# Patient Record
Sex: Male | Born: 1935 | Race: White | Hispanic: No | Marital: Married | Smoking: Current every day smoker
Health system: Southern US, Community
[De-identification: ages and names within clinical notes are randomized; demographics above are authoritative.]

## PROBLEM LIST (undated history)

## (undated) DIAGNOSIS — E785 Hyperlipidemia, unspecified: Secondary | ICD-10-CM

## (undated) HISTORY — PX: KIDNEY STONE SURGERY: SHX686

## (undated) HISTORY — PX: EYE SURGERY: SHX253

## (undated) HISTORY — DX: Hyperlipidemia, unspecified: E78.5

---

## 2002-10-20 ENCOUNTER — Ambulatory Visit (HOSPITAL_COMMUNITY): Admission: RE | Admit: 2002-10-20 | Discharge: 2002-10-20 | Payer: Self-pay | Admitting: Ophthalmology

## 2004-04-28 ENCOUNTER — Ambulatory Visit: Payer: Self-pay | Admitting: Family Medicine

## 2004-06-28 ENCOUNTER — Ambulatory Visit: Payer: Self-pay | Admitting: Family Medicine

## 2004-08-08 ENCOUNTER — Ambulatory Visit: Payer: Self-pay | Admitting: Family Medicine

## 2004-09-21 ENCOUNTER — Ambulatory Visit: Payer: Self-pay | Admitting: Family Medicine

## 2005-02-16 ENCOUNTER — Ambulatory Visit: Payer: Self-pay | Admitting: Family Medicine

## 2005-03-14 ENCOUNTER — Ambulatory Visit: Payer: Self-pay | Admitting: Family Medicine

## 2005-03-22 ENCOUNTER — Ambulatory Visit (HOSPITAL_COMMUNITY): Admission: RE | Admit: 2005-03-22 | Discharge: 2005-03-22 | Payer: Self-pay | Admitting: Urology

## 2005-03-29 ENCOUNTER — Ambulatory Visit (HOSPITAL_COMMUNITY): Admission: RE | Admit: 2005-03-29 | Discharge: 2005-03-29 | Payer: Self-pay | Admitting: Urology

## 2005-04-26 ENCOUNTER — Ambulatory Visit (HOSPITAL_COMMUNITY): Admission: RE | Admit: 2005-04-26 | Discharge: 2005-04-26 | Payer: Self-pay | Admitting: Urology

## 2005-04-28 ENCOUNTER — Ambulatory Visit (HOSPITAL_COMMUNITY): Admission: RE | Admit: 2005-04-28 | Discharge: 2005-04-28 | Payer: Self-pay | Admitting: Urology

## 2005-07-06 ENCOUNTER — Ambulatory Visit: Payer: Self-pay | Admitting: Family Medicine

## 2005-11-08 ENCOUNTER — Ambulatory Visit: Payer: Self-pay | Admitting: Family Medicine

## 2006-01-10 ENCOUNTER — Ambulatory Visit: Payer: Self-pay | Admitting: Family Medicine

## 2006-01-30 ENCOUNTER — Ambulatory Visit: Payer: Self-pay | Admitting: Family Medicine

## 2006-03-12 ENCOUNTER — Ambulatory Visit: Payer: Self-pay | Admitting: Family Medicine

## 2006-07-02 IMAGING — CR DG ABDOMEN 1V
2 series · 2 of 2 positions shown · non-contrast
Comparison: None.

CLINICAL DATA: Left renal calculus. Pre-lithotripsy evaluation.

ABDOMEN - 1 VIEW

[view not recorded (1 of 2)]
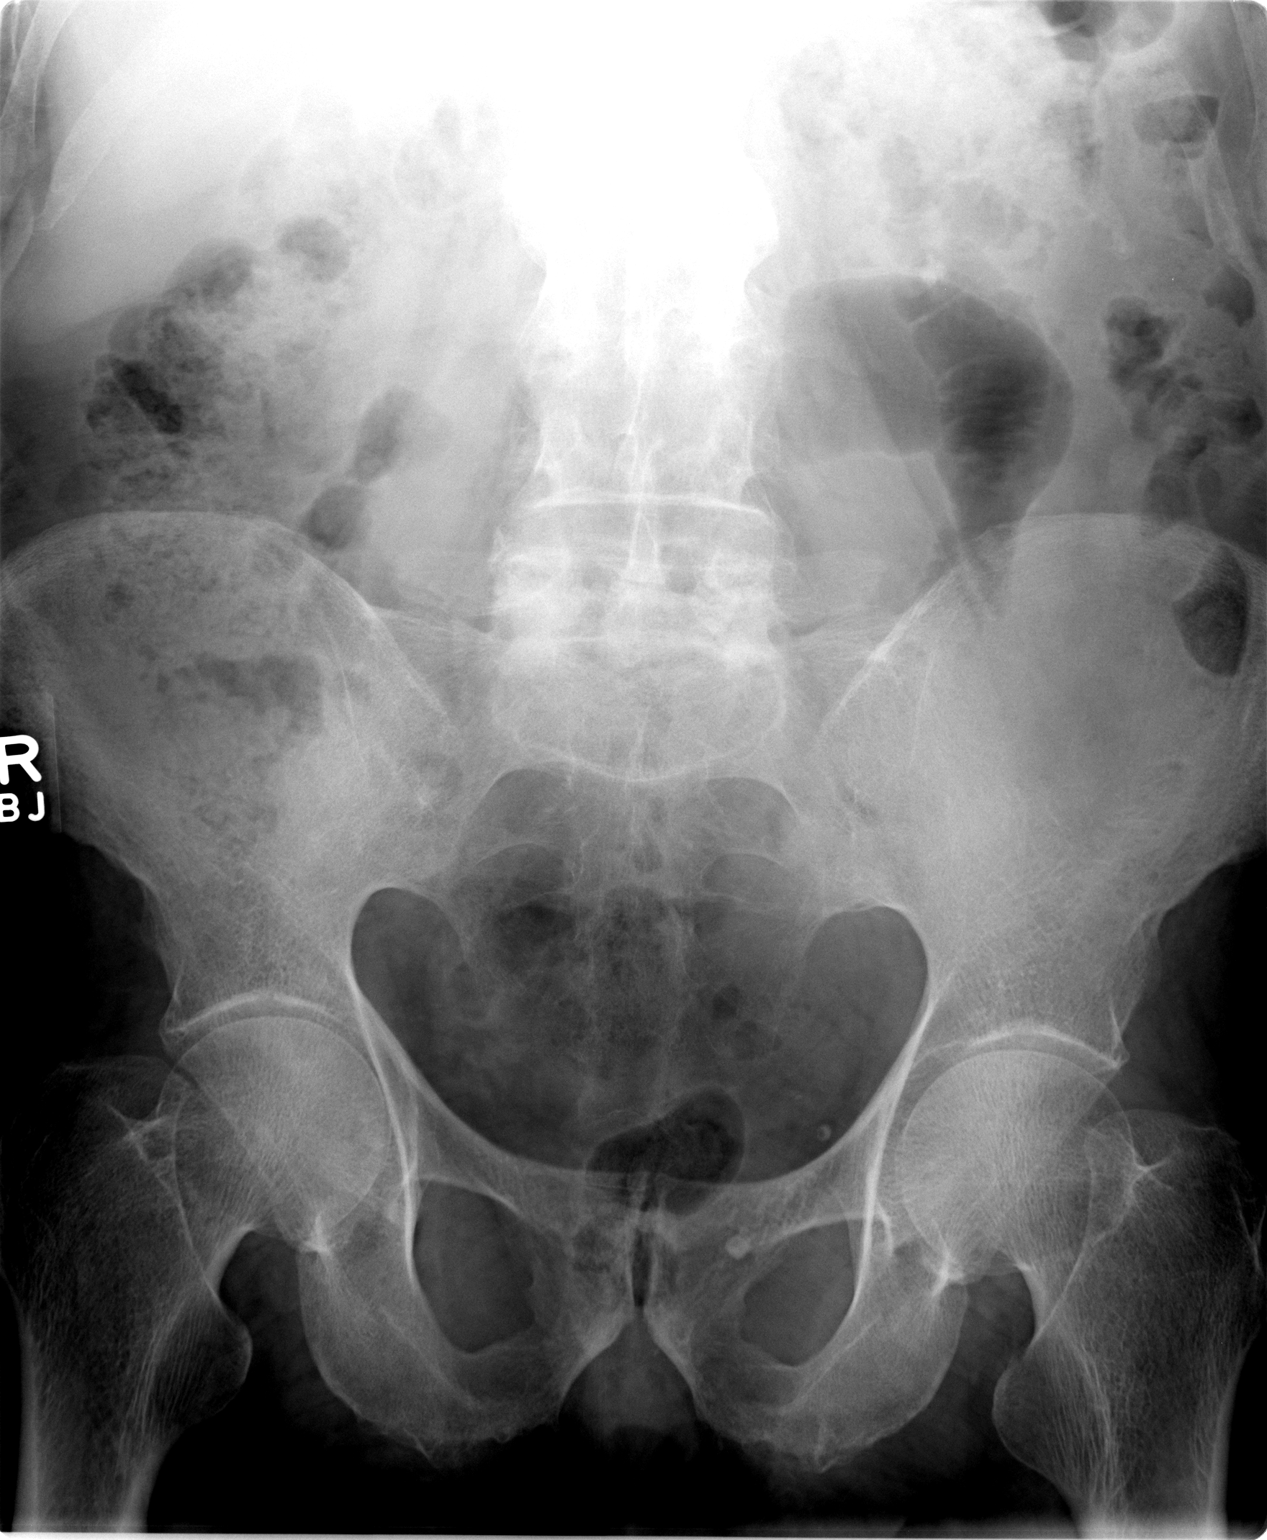

[view not recorded (2 of 2)]
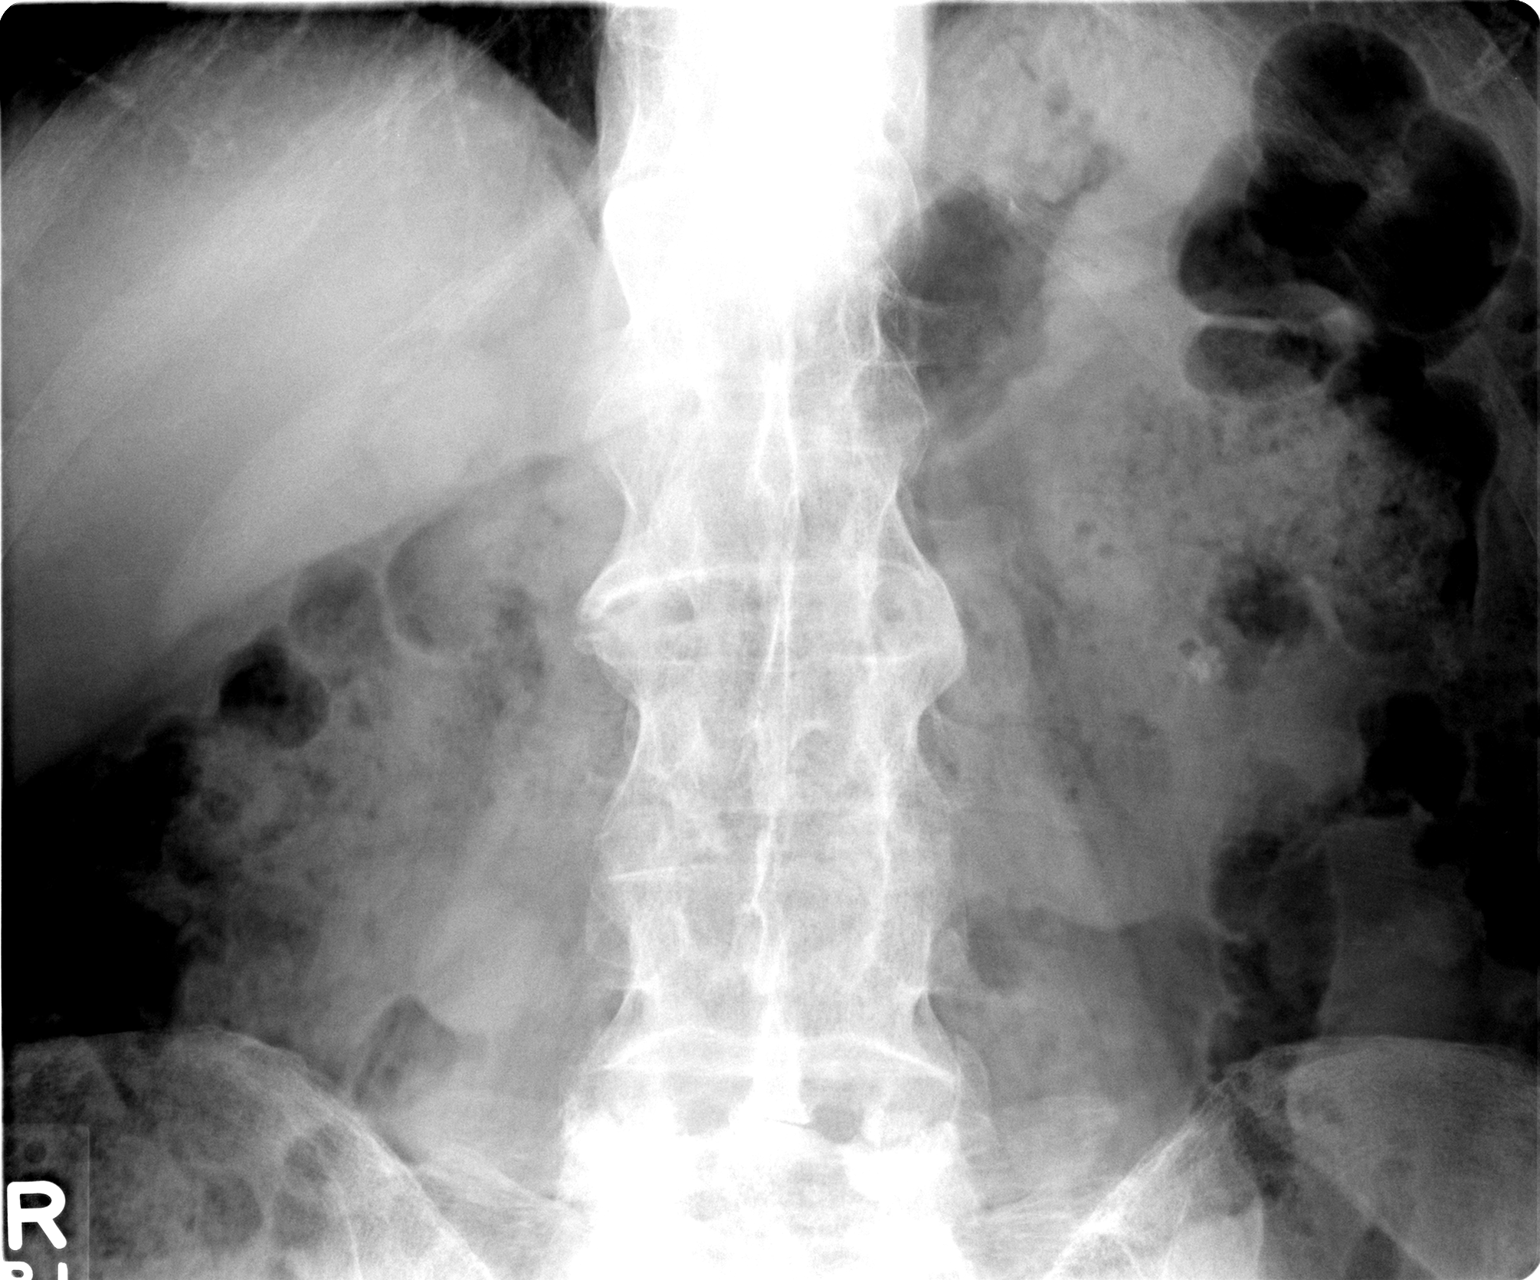

[2 of 2 positions shown; findings below may reference images not displayed]

FINDINGS: 1.0 cm lower pole left renal calculus. Normal bowel gas pattern. Left
pelvic phleboliths. Lumbar spine degenerative changes.
IMPRESSION: 1.0 cm lower pole left renal calculus.

## 2006-08-08 IMAGING — CT CT PELVIS W/O CM
1 of 2 series · 15 of 32 positions shown, 19 images · non-contrast
Comparison: Recent abdomen radiographs.

<!--  IDXRADR:ADDEND:BEGIN -->Addendum Begins
<!--  IDXRADR:ADDEND:INNER_BEGIN -->There is also evidence of bilateral L5 pars interarticularis defects with
probable grade 1 spondylolisthesis at the L5-S1 level. This could be better
evaluated with lumbar spine radiographs.
<!--  IDXRADR:ADDEND:INNER_END -->Addendum Ends
<!--  IDXRADR:ADDEND:END -->Clinical Data:  Followup left renal calculus, status post lithotripsy.

ABDOMEN CT WITHOUT CONTRAST - URINARY STONE PROTOCOL
TECHNIQUE: Multidetector CT imaging of the abdomen was performed following the
urinary stone protocol.  No oral or intravenous contrast was administered.
TECHNIQUE: Multidetector CT imaging of the pelvis was performed following the

[Series 4438: — · axial · 0.65mm/px · z∈[+1254,+1604]mm · 15 of 78 slices shown, 19 images]
[im 4/78  soft-tissue]
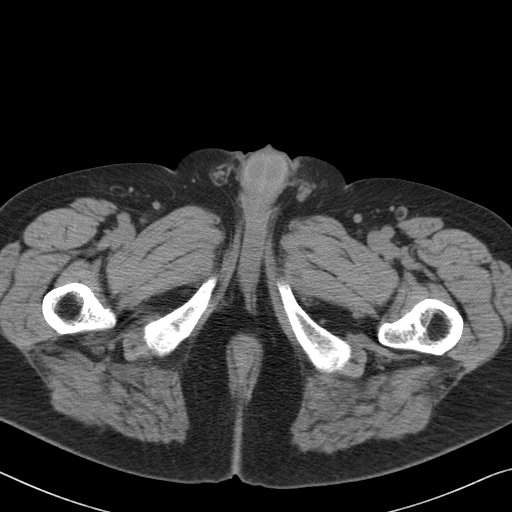
[im 4/78  bone]
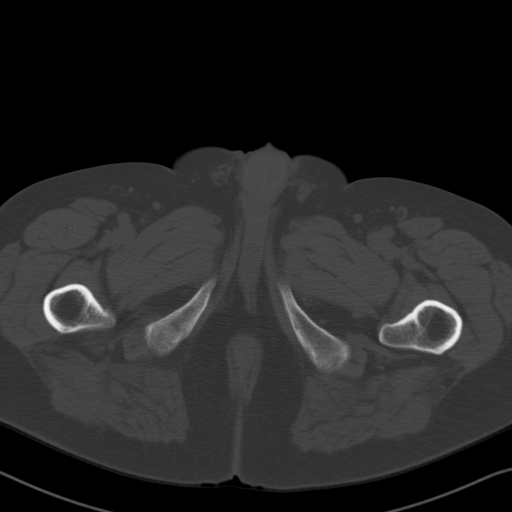
[im 11/78  soft-tissue]
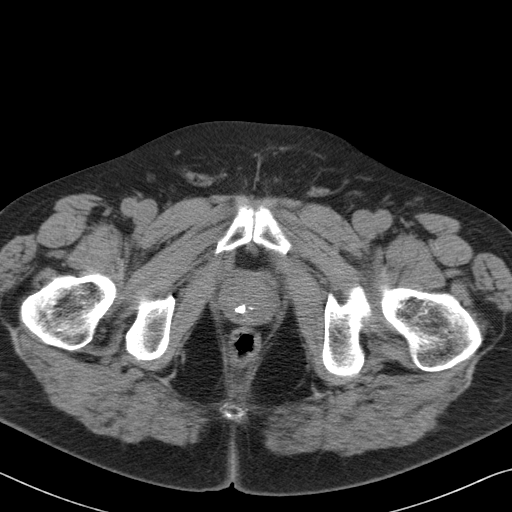
[im 17/78  soft-tissue]
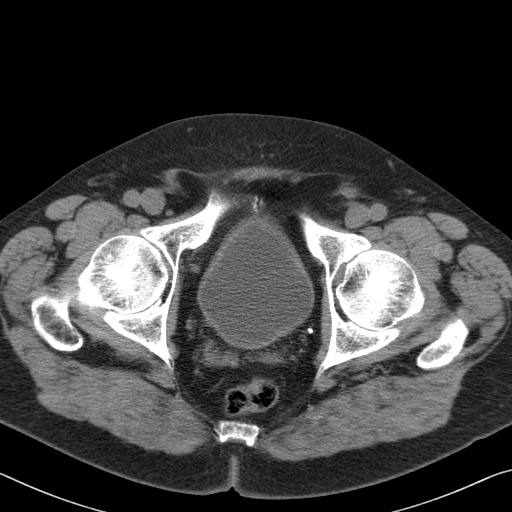
[im 21/78  soft-tissue]
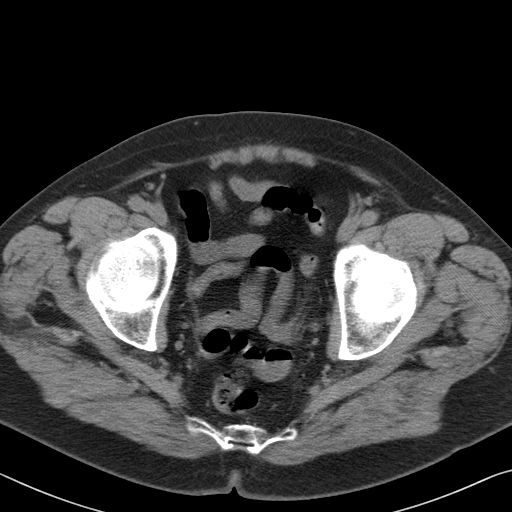
[im 27/78  soft-tissue]
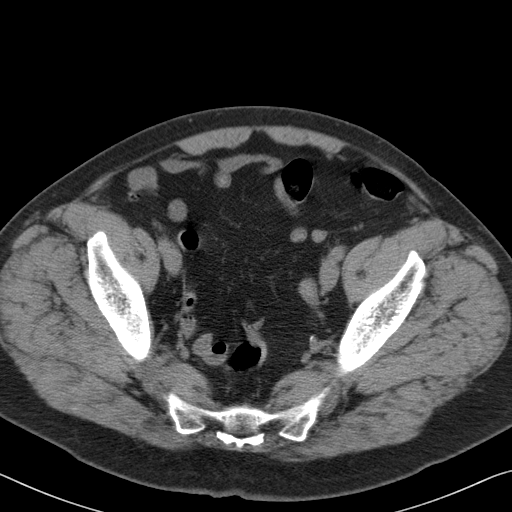
[im 34/78  soft-tissue]
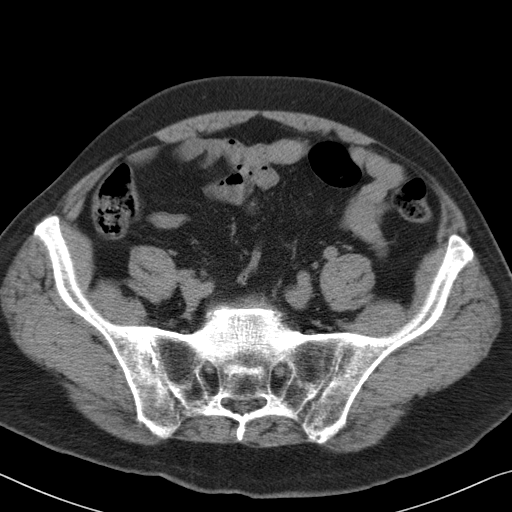
[im 41/78  soft-tissue]
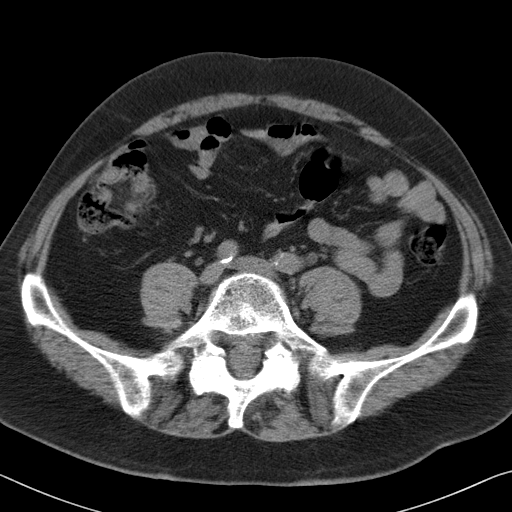
[im 44/78  soft-tissue]
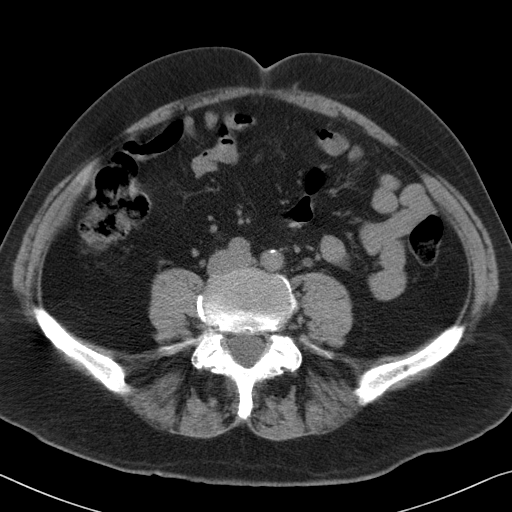
[im 51/78  soft-tissue]
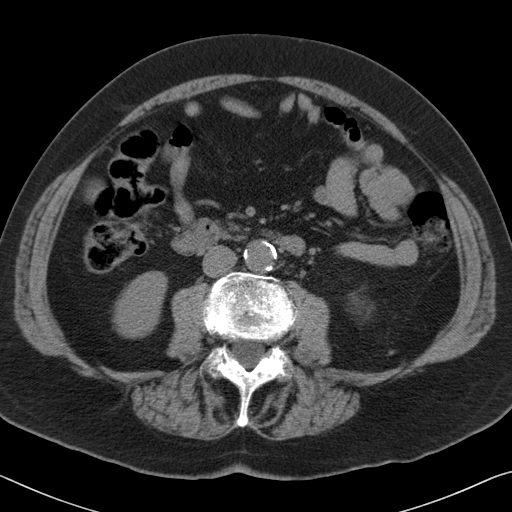
[im 51/78  bone]
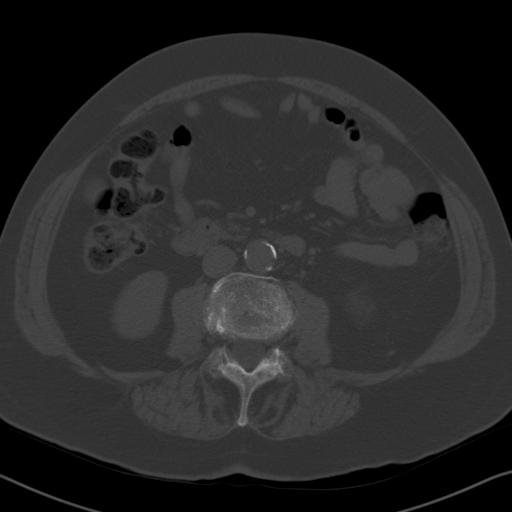
[im 57/78  soft-tissue]
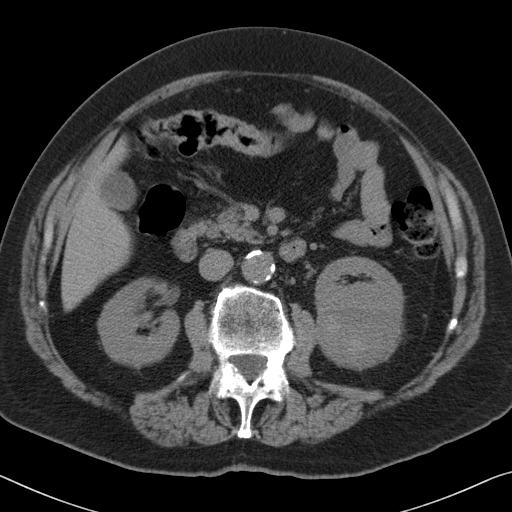
[im 61/78  soft-tissue]
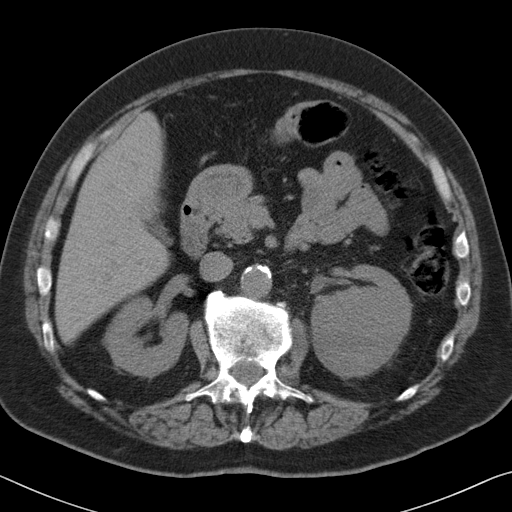
[im 64/78  lung]
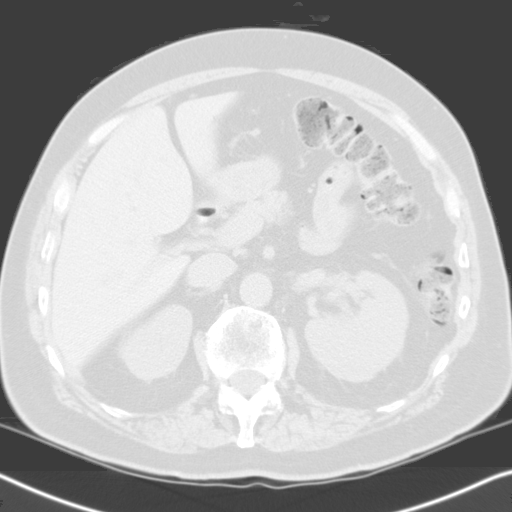
[im 67/78  soft-tissue]
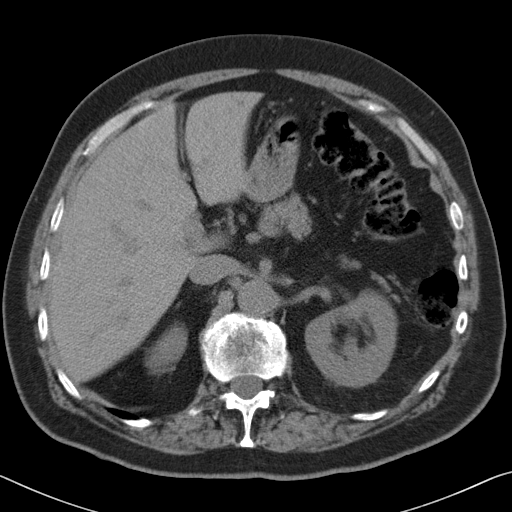
[im 67/78  lung]
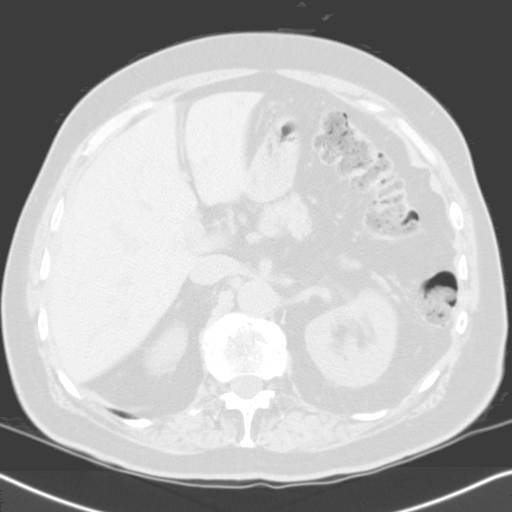
[im 71/78  lung]
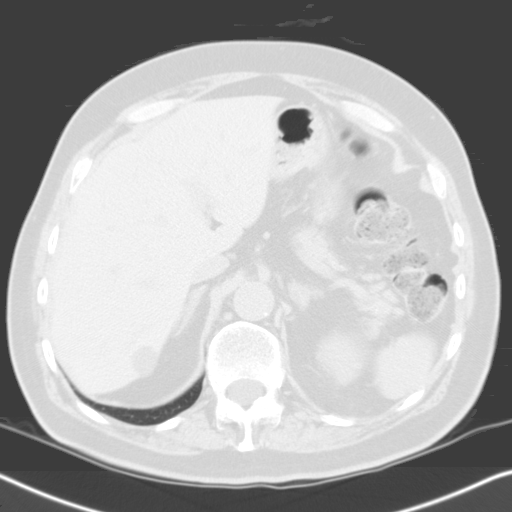
[im 74/78  soft-tissue]
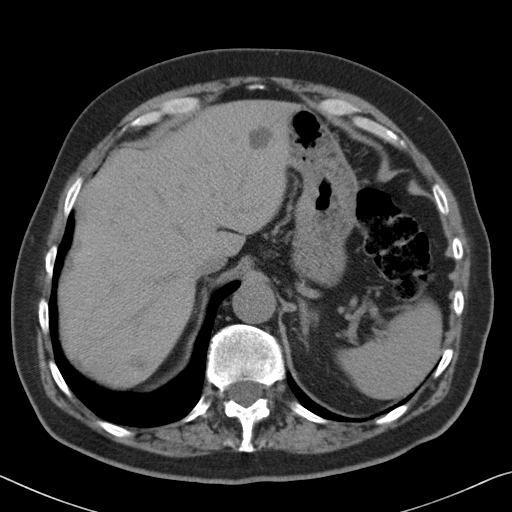
[im 74/78  lung]
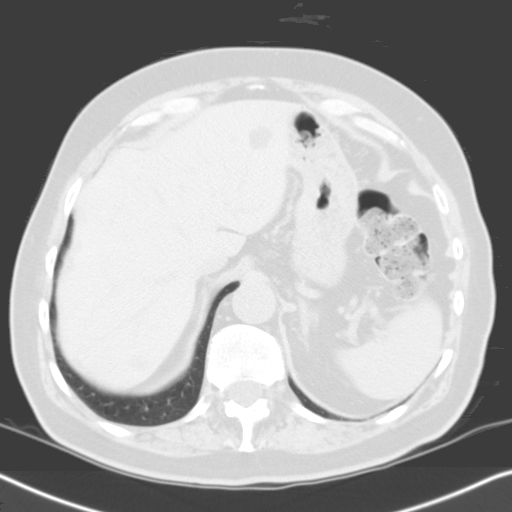

[15 of 32 positions shown; findings below may reference images not displayed]

FINDINGS: 5.6 x 4.2 cm left posterior subcapsular hematoma. This causing
flattening of the posterior aspect of the kidney and anterior displacement of
the kidney. There are also 2 tiny calculi or stone fragments in the lower pole
of the left kidney. A 1.0 cm exophytic cyst is noted arising from the posterior
aspect of the upper pole of the right kidney. Multiple simple appearing liver
cysts. The largest is in the right lobe and measures 2.0 cm in maximum diameter.
No ureteral calculi or hydronephrosis seen. Lumbar spine degenerative changes.

IMPRESSION

1. 5.6 x 4.2 cm left posterior subcapsular hematoma causing flattening of the
posterior aspect of the kidney and anterior displacement of the kidney.

2. Two tiny nonobstructing lower pole left renal calculi or stone fragments.

PELVIS CT WITHOUT CONTRAST - URINARY STONE PROTOCOL
FINDINGS: 3 mm calculus or stone fragment in the inferior aspect of the urinary
bladder on the left. No ureteral calculi or ureteral dilatation. Central
prostatic calcifications without definite enlargement. Bilateral pelvic
phleboliths.

IMPRESSION

3 mm left bladder calculus or stone fragment.

## 2007-05-10 ENCOUNTER — Ambulatory Visit (HOSPITAL_COMMUNITY): Admission: RE | Admit: 2007-05-10 | Discharge: 2007-05-10 | Payer: Self-pay | Admitting: Ophthalmology

## 2010-07-22 NOTE — H&P (Signed)
NAME:  Marvin Rose, Marvin Rose              ACCOUNT NO.:  [id number]   MEDICAL RECORD NO.:  [id number]          PATIENT TYPE:  AMB   LOCATION:                                FACILITY:  [facility]   PHYSICIAN:  Ky Barban, M.D.DATE OF BIRTH:  [birth year]   DATE OF ADMISSION:  DATE OF DISCHARGE:  LH                                HISTORY & PHYSICAL   CHIEF COMPLAINT:  Recent left flank pain.   This 76 year old male was seen by me in December and went to [facility] ER  where CT was done which showed that he had a 1 cm stone in the left kidney,  no obstruction.  He is __________,  I have scheduled him for ESL for left  renal calculus procedure.  Need for additional procedure and complications  were explained.  He understands.   MEDICATIONS:  Oxycodone, Prilosec, Septra, Phenergan.   PERSONAL HISTORY:  Smokes one-half pack a day, does not drink.   REVIEW OF SYSTEMS:  Unremarkable.   ALLERGIES:  MACRODANTIN.   PHYSICAL EXAMINATION:  GENERAL:  Well-nourished, well-developed male.  VITAL SIGNS:  Blood pressure 114/68, temperature 98.  CENTRAL NERVOUS SYSTEM:  Negative.  HEAD, NECK, EYE, ENT: Negative.  CHEST:  Regular.  HEART: Regular sinus.  ABDOMEN:  Soft, flat.  Liver and spleen, kidneys not palpable.  No CVA  tenderness.  EXTERNAL GENITALIA: Circumcised.  Meatus adequate.  Testicles are normal.  RECTAL: Sphincter tone is normal. No rectal mass.  Prostate 1+, smooth, and  firm.   IMPRESSION:  Left renal calculus.   PLAN:  ESL for left renal calculus.      Ky Barban, M.D.  Electronically Signed     MIJ/MEDQ  D:  03/21/2005  T:  03/21/2005  Job:  [id number]

## 2013-07-14 DIAGNOSIS — E785 Hyperlipidemia, unspecified: Secondary | ICD-10-CM | POA: Insufficient documentation

## 2013-07-14 DIAGNOSIS — K219 Gastro-esophageal reflux disease without esophagitis: Secondary | ICD-10-CM | POA: Insufficient documentation

## 2018-09-09 ENCOUNTER — Other Ambulatory Visit: Payer: Self-pay

## 2018-09-09 ENCOUNTER — Encounter: Payer: Self-pay | Admitting: Family Medicine

## 2018-09-09 ENCOUNTER — Ambulatory Visit (INDEPENDENT_AMBULATORY_CARE_PROVIDER_SITE_OTHER): Payer: Medicare Other | Admitting: Family Medicine

## 2018-09-09 VITALS — BP 133/83 | HR 86 | Temp 97.0°F | Ht 71.0 in | Wt 140.4 lb

## 2018-09-09 DIAGNOSIS — E782 Mixed hyperlipidemia: Secondary | ICD-10-CM

## 2018-09-09 MED ORDER — PRAVASTATIN SODIUM 40 MG PO TABS: 40.0000 mg | ORAL_TABLET | Freq: Every day | ORAL | 2 refills | Status: DC

## 2018-09-09 NOTE — Progress Notes (Signed)
Subjective:  Patient ID: Marvin Bryantichard J Tahir, male    DOB: 12/12/1935  Age: 83 y.o. MRN: [id number]  CC: New Patient (Initial Visit)   HPI Marvin Rose presents for follow-up of elevated cholesterol. Doing well without complaints on current medication. Denies side effects of statin including myalgia and arthralgia and nausea. Blood work of 08/05/2018 recovered from care everywhere. Chemistries, lipid profile, CBC  in normal range. LDL is 72. Currently no chest pain, shortness of breath or other cardiovascular related symptoms noted. History Wayde has no past medical history on file.   He has a past surgical history that includes Eye surgery.   His family history is not on file.He reports that he has been smoking cigarettes. He has smoked for the past 74.00 years. He has never used smokeless tobacco. He reports previous alcohol use. He reports that he does not use drugs.  Current Outpatient Medications on File Prior to Visit  Medication Sig Dispense Refill   aspirin 325 MG tablet Take by mouth.     Cholecalciferol (VITAMIN D3) 10 MCG (400 UNIT) CAPS Take by mouth.     CYANOCOBALAMIN PO Take by mouth.     Multiple Vitamin (THERA) TABS Take by mouth.     No current facility-administered medications on file prior to visit.     ROS Review of Systems  Constitutional: Negative.   HENT: Negative.   Eyes: Negative for visual disturbance.  Respiratory: Negative for cough and shortness of breath.   Cardiovascular: Negative for chest pain and leg swelling.  Gastrointestinal: Negative for abdominal pain, diarrhea, nausea and vomiting.  Genitourinary: Negative for difficulty urinating.  Musculoskeletal: Negative for arthralgias and myalgias.  Skin: Negative for rash.  Neurological: Negative for headaches.  Psychiatric/Behavioral: Negative for sleep disturbance.    Objective:  BP 133/83    Pulse 86    Temp (!) 97 F (36.1 C) (Oral)    Ht 5\' 11"  (1.803 m)    Wt 140 lb 6.4 oz (63.7 kg)     BMI 19.58 kg/m   BP Readings from Last 3 Encounters:  09/09/18 133/83    Wt Readings from Last 3 Encounters:  09/09/18 140 lb 6.4 oz (63.7 kg)     Physical Exam Constitutional:      General: He is not in acute distress.    Appearance: He is well-developed.  HENT:     Head: Normocephalic and atraumatic.     Right Ear: External ear normal.     Left Ear: External ear normal.     Nose: Nose normal.  Eyes:     Conjunctiva/sclera: Conjunctivae normal.     Pupils: Pupils are equal, round, and reactive to light.  Neck:     Musculoskeletal: Normal range of motion and neck supple.  Cardiovascular:     Rate and Rhythm: Normal rate and regular rhythm.     Heart sounds: Normal heart sounds. No murmur.  Pulmonary:     Effort: Pulmonary effort is normal. No respiratory distress.     Breath sounds: Normal breath sounds. No wheezing or rales.  Abdominal:     Palpations: Abdomen is soft.     Tenderness: There is no abdominal tenderness.  Musculoskeletal: Normal range of motion.  Skin:    General: Skin is warm and dry.  Neurological:     Mental Status: He is alert and oriented to person, place, and time.     Deep Tendon Reflexes: Reflexes are normal and symmetric.  Psychiatric:  Behavior: Behavior normal.        Thought Content: Thought content normal.        Judgment: Judgment normal.    Assessment & Plan:   There are no diagnoses linked to this encounter. I have discontinued Tana Conch. Severino's omeprazole. I have also changed his pravastatin. Additionally, I am having him maintain his Thera, Vitamin D3, aspirin, and CYANOCOBALAMIN PO.  Meds ordered this encounter  Medications   pravastatin (PRAVACHOL) 40 MG tablet    Sig: Take 1 tablet (40 mg total) by mouth daily.    Dispense:  90 tablet    Refill:  2     Follow-up: Return in about 6 months (around 03/12/2019).  Claretta Fraise, M.D.

## 2019-03-12 ENCOUNTER — Ambulatory Visit (INDEPENDENT_AMBULATORY_CARE_PROVIDER_SITE_OTHER): Payer: Medicare Other | Admitting: Family Medicine

## 2019-03-12 ENCOUNTER — Encounter: Payer: Self-pay | Admitting: Family Medicine

## 2019-03-12 DIAGNOSIS — Z5329 Procedure and treatment not carried out because of patient's decision for other reasons: Secondary | ICD-10-CM

## 2019-03-12 NOTE — Progress Notes (Signed)
LMOVM at 2:52, 2:58 Mobile: "Wireless customer not available." 2:02

## 2019-03-24 ENCOUNTER — Other Ambulatory Visit: Payer: Self-pay

## 2019-03-24 ENCOUNTER — Ambulatory Visit: Payer: Medicare Other | Attending: Internal Medicine

## 2019-03-24 DIAGNOSIS — Z20822 Contact with and (suspected) exposure to covid-19: Secondary | ICD-10-CM

## 2019-03-25 LAB — NOVEL CORONAVIRUS, NAA: SARS-CoV-2, NAA: NOT DETECTED

## 2019-03-26 ENCOUNTER — Telehealth: Payer: Self-pay | Admitting: Family Medicine

## 2019-03-26 NOTE — Telephone Encounter (Signed)
Pt aware covid lab test negative, not detected °

## 2019-03-28 ENCOUNTER — Ambulatory Visit (INDEPENDENT_AMBULATORY_CARE_PROVIDER_SITE_OTHER): Payer: Medicare Other | Admitting: Family Medicine

## 2019-03-28 ENCOUNTER — Encounter: Payer: Self-pay | Admitting: Family Medicine

## 2019-03-28 DIAGNOSIS — K219 Gastro-esophageal reflux disease without esophagitis: Secondary | ICD-10-CM

## 2019-03-28 DIAGNOSIS — E782 Mixed hyperlipidemia: Secondary | ICD-10-CM

## 2019-03-28 MED ORDER — PRAVASTATIN SODIUM 40 MG PO TABS: 40.0000 mg | ORAL_TABLET | Freq: Every day | ORAL | 2 refills | Status: DC

## 2019-03-28 NOTE — Progress Notes (Signed)
Subjective:    Patient ID: Marvin Rose, male    DOB: 1935-06-09, 84 y.o.   MRN: [id number]   HPI: Marvin Rose is a 84 y.o. male presenting for  in for follow-up of elevated cholesterol. Doing well without complaints on current medication. Denies side effects of statin including myalgia and arthralgia and nausea. Currently no chest pain, shortness of breath or other cardiovascular related symptoms noted. Patient in for follow-up of GERD. Currently asymptomatic taking  OTC alka seltzer heartburn prn. There is no chest pain and only occasional heartburn. No hematemesis and no melena. No dysphagia or choking. Onset is remote. Progression is stable. Complicating factors, none.    Depression screen PHQ 2/9 09/09/2018  Decreased Interest 0  Down, Depressed, Hopeless 0  PHQ - 2 Score 0     Relevant past medical, surgical, family and social history reviewed and updated as indicated.  Interim medical history since our last visit reviewed. Allergies and medications reviewed and updated.  ROS:  Review of Systems  Constitutional: Negative for fever.  Respiratory: Negative for shortness of breath.   Cardiovascular: Negative for chest pain.  Musculoskeletal: Negative for arthralgias.  Skin: Negative for rash.      Social History   Tobacco Use  Smoking Status Current Every Day Smoker  . Years: 74.00  . Types: Cigarettes  Smokeless Tobacco Never Used  Tobacco Comment   1/2 pack smoked daily       Objective:     Wt Readings from Last 3 Encounters:  09/09/18 140 lb 6.4 oz (63.7 kg)     Exam deferred. Pt. Harboring due to COVID 19. Phone visit performed.   Assessment & Plan:   1. Mixed hyperlipidemia   2. Gastroesophageal reflux disease without esophagitis     Meds ordered this encounter  Medications  . pravastatin (PRAVACHOL) 40 MG tablet    Sig: Take 1 tablet (40 mg total) by mouth daily.    Dispense:  90 tablet    Refill:  2        Diagnoses and all  orders for this visit:  Mixed hyperlipidemia -     pravastatin (PRAVACHOL) 40 MG tablet; Take 1 tablet (40 mg total) by mouth daily.  Gastroesophageal reflux disease without esophagitis  Currently stable just using over-the-counter Alka-Seltzer heartburn medication.  Even on the occasion where he might have the symptom it is mild easily relieved and usually from eating something spicy.  As result he discontinued his prescription medicine and I concurred with him that it did not seem necessary.  He will let me know if the heartburn becomes more frequent or severe or both.  Virtual Visit via telephone Note  I discussed the limitations, risks, security and privacy concerns of performing an evaluation and management service by telephone and the availability of in person appointments. The patient was identified with two identifiers. Pt.expressed understanding and agreed to proceed. Pt. Is at home. Dr. Darlyn Read is in his office.  Follow Up Instructions:   I discussed the assessment and treatment plan with the patient. The patient was provided an opportunity to ask questions and all were answered. The patient agreed with the plan and demonstrated an understanding of the instructions.   The patient was advised to call back or seek an in-person evaluation if the symptoms worsen or if the condition fails to improve as anticipated.   Total minutes including chart review and phone contact time: 16   Follow up plan: Return in about  6 months (around 09/25/2019).  Claretta Fraise, MD [place]

## 2019-09-30 ENCOUNTER — Other Ambulatory Visit: Payer: Self-pay

## 2019-09-30 ENCOUNTER — Ambulatory Visit (INDEPENDENT_AMBULATORY_CARE_PROVIDER_SITE_OTHER): Payer: Medicare Other | Admitting: Family Medicine

## 2019-09-30 ENCOUNTER — Encounter: Payer: Self-pay | Admitting: Family Medicine

## 2019-09-30 VITALS — BP 134/84 | HR 92 | Temp 98.0°F | Ht 71.0 in | Wt 136.0 lb

## 2019-09-30 DIAGNOSIS — K219 Gastro-esophageal reflux disease without esophagitis: Secondary | ICD-10-CM | POA: Diagnosis not present

## 2019-09-30 DIAGNOSIS — E559 Vitamin D deficiency, unspecified: Secondary | ICD-10-CM | POA: Diagnosis not present

## 2019-09-30 DIAGNOSIS — E782 Mixed hyperlipidemia: Secondary | ICD-10-CM | POA: Diagnosis not present

## 2019-09-30 DIAGNOSIS — Z125 Encounter for screening for malignant neoplasm of prostate: Secondary | ICD-10-CM

## 2019-09-30 MED ORDER — PRAVASTATIN SODIUM 40 MG PO TABS: 40.0000 mg | ORAL_TABLET | Freq: Every day | ORAL | 2 refills | Status: DC

## 2019-09-30 NOTE — Progress Notes (Signed)
Subjective:  Patient ID: Marvin Rose, male    DOB: 03-09-1935  Age: 84 y.o. MRN: [id number]  CC: Follow-up (6 month)   HPI TRAVONE GEORG presents for patient in for follow-up of elevated cholesterol. Doing well without complaints on current medication. Denies side effects of statin including myalgia and arthralgia and nausea. Also in today for liver function testing. Currently no chest pain, shortness of breath or other cardiovascular related symptoms noted.  Patient in for follow-up of GERD. Currently asymptomatic.  He recently discontinued his PPI due to concerns for side effects on the liver.. There is no chest pain or heartburn. No hematemesis and no melena. No dysphagia or choking. Onset is remote. Progression is stable. Complicating factors, none.   Depression screen [facility] 2/9 09/30/2019 09/09/2018  Decreased Interest 0 0  Down, Depressed, Hopeless 0 0  PHQ - 2 Score 0 0    History Maxey has no past medical history on file.   He has a past surgical history that includes Eye surgery.   His family history is not on file.He reports that he has been smoking cigarettes. He has smoked for the past 74.00 years. He has never used smokeless tobacco. He reports previous alcohol use. He reports that he does not use drugs.    ROS Review of Systems  Constitutional: Negative.   HENT: Negative.   Eyes: Negative for visual disturbance.  Respiratory: Negative for cough and shortness of breath.   Cardiovascular: Negative for chest pain and leg swelling.  Gastrointestinal: Negative for abdominal pain, diarrhea, nausea and vomiting.  Genitourinary: Negative for difficulty urinating.  Musculoskeletal: Negative for arthralgias and myalgias.  Skin: Negative for rash.  Neurological: Negative for headaches.  Psychiatric/Behavioral: Negative for sleep disturbance.    Objective:  BP (!) 134/84   Pulse 92   Temp 98 F (36.7 C) (Temporal)   Ht '5\' 11"'$  (1.803 m)   Wt 136 lb (61.7 kg)    BMI 18.97 kg/m   BP Readings from Last 3 Encounters:  09/30/19 (!) 134/84  09/09/18 133/83    Wt Readings from Last 3 Encounters:  09/30/19 136 lb (61.7 kg)  09/09/18 140 lb 6.4 oz (63.7 kg)     Physical Exam Constitutional:      General: He is not in acute distress.    Appearance: He is well-developed.  HENT:     Head: Normocephalic and atraumatic.     Right Ear: External ear normal.     Left Ear: External ear normal.     Nose: Nose normal.  Eyes:     Conjunctiva/sclera: Conjunctivae normal.     Pupils: Pupils are equal, round, and reactive to light.  Cardiovascular:     Rate and Rhythm: Normal rate and regular rhythm.     Heart sounds: Normal heart sounds. No murmur heard.   Pulmonary:     Effort: Pulmonary effort is normal. No respiratory distress.     Breath sounds: Normal breath sounds. No wheezing or rales.  Abdominal:     Palpations: Abdomen is soft.     Tenderness: There is no abdominal tenderness.  Musculoskeletal:        General: Normal range of motion.     Cervical back: Normal range of motion and neck supple.  Skin:    General: Skin is warm and dry.  Neurological:     Mental Status: He is alert and oriented to person, place, and time.     Deep Tendon Reflexes: Reflexes are normal  and symmetric.  Psychiatric:        Behavior: Behavior normal.        Thought Content: Thought content normal.        Judgment: Judgment normal.       Assessment & Plan:   Abdimalik was seen today for follow-up.  Diagnoses and all orders for this visit:  Gastroesophageal reflux disease without esophagitis -     CBC with Differential/Platelet -     CMP14+EGFR  Mixed hyperlipidemia -     CBC with Differential/Platelet -     CMP14+EGFR -     Lipid panel -     pravastatin (PRAVACHOL) 40 MG tablet; Take 1 tablet (40 mg total) by mouth daily.  Vitamin D deficiency -     CBC with Differential/Platelet -     CMP14+EGFR -     VITAMIN D 25 Hydroxy (Vit-D Deficiency,  Fractures)  Screening for prostate cancer -     PSA Total (Reflex To Free)       I am having Weiland J. Milner maintain his Thera, Vitamin D3, aspirin, CYANOCOBALAMIN PO, and pravastatin.  Allergies as of 09/30/2019      Reactions   Demerol  [meperidine Hcl] Rash   Meperidine And Related Rash      Medication List       Accurate as of September 30, 2019  3:03 PM. If you have any questions, ask your nurse or doctor.        aspirin 325 MG tablet Take by mouth.   CYANOCOBALAMIN PO Take by mouth.   pravastatin 40 MG tablet Commonly known as: PRAVACHOL Take 1 tablet (40 mg total) by mouth daily.   Thera Tabs Take by mouth.   Vitamin D3 10 MCG (400 UNIT) Caps Take by mouth.        Follow-up: Return in about 6 months (around 04/01/2020).  Claretta Fraise, M.D.

## 2019-10-01 LAB — LIPID PANEL
Chol/HDL Ratio: 2.5 ratio (ref 0.0–5.0)
Cholesterol, Total: 149 mg/dL (ref 100–199)
HDL: 60 mg/dL (ref >39)
LDL Chol Calc (NIH): 79 mg/dL (ref 0–99)
Triglycerides: 46 mg/dL (ref 0–149)
VLDL Cholesterol Cal: 10 mg/dL (ref 5–40)

## 2019-10-01 LAB — CMP14+EGFR
ALT: 8 IU/L (ref 0–44)
AST: 15 IU/L (ref 0–40)
Albumin/Globulin Ratio: 1.6 (ref 1.2–2.2)
Albumin: 4.1 g/dL (ref 3.6–4.6)
Alkaline Phosphatase: 108 IU/L (ref 48–121)
BUN/Creatinine Ratio: 21 (ref 10–24)
BUN: 19 mg/dL (ref 8–27)
Bilirubin Total: 0.4 mg/dL (ref 0.0–1.2)
CO2: 24 mmol/L (ref 20–29)
Calcium: 9.4 mg/dL (ref 8.6–10.2)
Chloride: 103 mmol/L (ref 96–106)
Creatinine, Ser: 0.91 mg/dL (ref 0.76–1.27)
GFR calc Af Amer: 90 mL/min/{1.73_m2} (ref >59)
GFR calc non Af Amer: 78 mL/min/{1.73_m2} (ref >59)
Globulin, Total: 2.6 g/dL (ref 1.5–4.5)
Glucose: 97 mg/dL (ref 65–99)
Potassium: 4.3 mmol/L (ref 3.5–5.2)
Sodium: 141 mmol/L (ref 134–144)
Total Protein: 6.7 g/dL (ref 6.0–8.5)

## 2019-10-01 LAB — CBC WITH DIFFERENTIAL/PLATELET
Basophils Absolute: 0 10*3/uL (ref 0.0–0.2)
Basos: 0 %
EOS (ABSOLUTE): 0.1 10*3/uL (ref 0.0–0.4)
Eos: 2 %
Hematocrit: 41.5 % (ref 37.5–51.0)
Hemoglobin: 13.5 g/dL (ref 13.0–17.7)
Immature Grans (Abs): 0 10*3/uL (ref 0.0–0.1)
Immature Granulocytes: 0 %
Lymphocytes Absolute: 1.3 10*3/uL (ref 0.7–3.1)
Lymphs: 26 %
MCH: 31.8 pg (ref 26.6–33.0)
MCHC: 32.5 g/dL (ref 31.5–35.7)
MCV: 98 fL — ABNORMAL HIGH (ref 79–97)
Monocytes Absolute: 0.5 10*3/uL (ref 0.1–0.9)
Monocytes: 11 %
Neutrophils Absolute: 3 10*3/uL (ref 1.4–7.0)
Neutrophils: 61 %
Platelets: 233 10*3/uL (ref 150–450)
RBC: 4.24 x10E6/uL (ref 4.14–5.80)
RDW: 12.5 % (ref 11.6–15.4)
WBC: 5 10*3/uL (ref 3.4–10.8)

## 2019-10-01 LAB — PSA TOTAL (REFLEX TO FREE): Prostate Specific Ag, Serum: 3.1 ng/mL (ref 0.0–4.0)

## 2019-10-01 LAB — VITAMIN D 25 HYDROXY (VIT D DEFICIENCY, FRACTURES): Vit D, 25-Hydroxy: 58.2 ng/mL (ref 30.0–100.0)

## 2019-10-01 NOTE — Progress Notes (Signed)
Hello Marvin Rose,  Your lab result is normal and/or stable.Some minor variations that are not significant are commonly marked abnormal, but do not represent any medical problem for you.  Best regards, Anthea Udovich, M.D.

## 2020-04-01 ENCOUNTER — Ambulatory Visit: Payer: 59 | Admitting: Family Medicine

## 2020-04-21 ENCOUNTER — Other Ambulatory Visit: Payer: Self-pay

## 2020-04-21 ENCOUNTER — Ambulatory Visit (INDEPENDENT_AMBULATORY_CARE_PROVIDER_SITE_OTHER): Payer: 59 | Admitting: Family Medicine

## 2020-04-21 ENCOUNTER — Encounter: Payer: Self-pay | Admitting: Family Medicine

## 2020-04-21 VITALS — BP 139/82 | HR 85 | Temp 98.0°F | Resp 20 | Ht 71.0 in | Wt 137.5 lb

## 2020-04-21 DIAGNOSIS — E538 Deficiency of other specified B group vitamins: Secondary | ICD-10-CM

## 2020-04-21 DIAGNOSIS — E559 Vitamin D deficiency, unspecified: Secondary | ICD-10-CM | POA: Diagnosis not present

## 2020-04-21 DIAGNOSIS — E782 Mixed hyperlipidemia: Secondary | ICD-10-CM | POA: Diagnosis not present

## 2020-04-21 MED ORDER — PRAVASTATIN SODIUM 40 MG PO TABS: 40.0000 mg | ORAL_TABLET | Freq: Every day | ORAL | 1 refills | Status: DC

## 2020-04-21 NOTE — Progress Notes (Signed)
Subjective:  Patient ID: Marvin Rose, male    DOB: 1936-01-02  Age: 85 y.o. MRN: [id number]  CC: Medical Management of Chronic Issues   HPI Marvin Rose presents for follow-up of elevated cholesterol. Doing well without complaints on current medication. Denies side effects of statin including myalgia and arthralgia and nausea. Also in today for liver function testing. Currently no chest pain, shortness of breath or other cardiovascular  related symptoms noted. Also has had low Vit D & b12 . Using OTC supplements. No paresthesias or weakness. History Marvin Rose has no past medical history on file.   He has a past surgical history that includes Eye surgery.   His family history is not on file.He reports that he has been smoking cigarettes. He has smoked for the past 74.00 years. He has never used smokeless tobacco. He reports previous alcohol use. He reports that he does not use drugs.  Current Outpatient Medications on File Prior to Visit  Medication Sig Dispense Refill  . Cholecalciferol (VITAMIN D3) 10 MCG (400 UNIT) CAPS Take by mouth.    . Multiple Vitamin (THERA) TABS Take by mouth.     No current facility-administered medications on file prior to visit.    ROS Review of Systems  Constitutional: Negative for fever.  Respiratory: Negative for shortness of breath.   Cardiovascular: Negative for chest pain.  Musculoskeletal: Positive for arthralgias (at shoulders using OTC with adequate relief).  Skin: Negative for rash.    Objective:  BP 139/82   Pulse 85   Temp 98 F (36.7 C) (Temporal)   Resp 20   Ht $R'5\' 11"'jK$  (1.803 m)   Wt 137 lb 8 oz (62.4 kg)   SpO2 94%   BMI 19.18 kg/m   BP Readings from Last 3 Encounters:  04/21/20 139/82  09/30/19 (!) 134/84  09/09/18 133/83    Wt Readings from Last 3 Encounters:  04/21/20 137 lb 8 oz (62.4 kg)  09/30/19 136 lb (61.7 kg)  09/09/18 140 lb 6.4 oz (63.7 kg)     Physical Exam Vitals reviewed.  Constitutional:       Appearance: He is well-developed and well-nourished.  HENT:     Head: Normocephalic and atraumatic.     Right Ear: Tympanic membrane and external ear normal. No decreased hearing noted.     Left Ear: Tympanic membrane and external ear normal. No decreased hearing noted.     Mouth/Throat:     Pharynx: No oropharyngeal exudate or posterior oropharyngeal erythema.  Eyes:     Pupils: Pupils are equal, round, and reactive to light.  Cardiovascular:     Rate and Rhythm: Normal rate and regular rhythm.     Heart sounds: No murmur heard.   Pulmonary:     Effort: No respiratory distress.     Breath sounds: Normal breath sounds.  Abdominal:     General: Bowel sounds are normal.     Palpations: Abdomen is soft. There is no mass.     Tenderness: There is no abdominal tenderness.  Musculoskeletal:     Cervical back: Normal range of motion and neck supple.     No results found for: HGBA1C  Lab Results  Component Value Date   WBC 5.0 09/30/2019   HGB 13.5 09/30/2019   HCT 41.5 09/30/2019   PLT 233 09/30/2019   GLUCOSE 97 09/30/2019   CHOL 149 09/30/2019   TRIG 46 09/30/2019   HDL 60 09/30/2019   LDLCALC 79 09/30/2019   ALT  8 09/30/2019   AST 15 09/30/2019   NA 141 09/30/2019   K 4.3 09/30/2019   CL 103 09/30/2019   CREATININE 0.91 09/30/2019   BUN 19 09/30/2019   CO2 24 09/30/2019    No results found.  Assessment & Plan:   Marvin Rose was seen today for medical management of chronic issues.  Diagnoses and all orders for this visit:  Vitamin D deficiency -     CBC with Differential/Platelet -     CMP14+EGFR -     VITAMIN D 25 Hydroxy (Vit-D Deficiency, Fractures)  Mixed hyperlipidemia -     pravastatin (PRAVACHOL) 40 MG tablet; Take 1 tablet (40 mg total) by mouth daily. -     Lipid panel -     CBC with Differential/Platelet -     CMP14+EGFR  Vitamin B12 deficiency -     CBC with Differential/Platelet -     CMP14+EGFR -     Vitamin B12   I have discontinued  Marvin Rose's aspirin and CYANOCOBALAMIN PO. I am also having him maintain his Thera, Vitamin D3, and pravastatin.  Meds ordered this encounter  Medications  . pravastatin (PRAVACHOL) 40 MG tablet    Sig: Take 1 tablet (40 mg total) by mouth daily.    Dispense:  90 tablet    Refill:  1     Follow-up: Return in about 6 months (around 10/19/2020).  Claretta Fraise, M.D.

## 2020-04-22 LAB — CBC WITH DIFFERENTIAL/PLATELET
Basophils Absolute: 0 10*3/uL (ref 0.0–0.2)
Basos: 1 %
EOS (ABSOLUTE): 0.1 10*3/uL (ref 0.0–0.4)
Eos: 2 %
Hematocrit: 42.8 % (ref 37.5–51.0)
Hemoglobin: 13.9 g/dL (ref 13.0–17.7)
Immature Grans (Abs): 0 10*3/uL (ref 0.0–0.1)
Immature Granulocytes: 0 %
Lymphocytes Absolute: 1.4 10*3/uL (ref 0.7–3.1)
Lymphs: 31 %
MCH: 31.2 pg (ref 26.6–33.0)
MCHC: 32.5 g/dL (ref 31.5–35.7)
MCV: 96 fL (ref 79–97)
Monocytes Absolute: 0.5 10*3/uL (ref 0.1–0.9)
Monocytes: 11 %
Neutrophils Absolute: 2.5 10*3/uL (ref 1.4–7.0)
Neutrophils: 55 %
Platelets: 221 10*3/uL (ref 150–450)
RBC: 4.46 x10E6/uL (ref 4.14–5.80)
RDW: 12.2 % (ref 11.6–15.4)
WBC: 4.6 10*3/uL (ref 3.4–10.8)

## 2020-04-22 LAB — CMP14+EGFR
ALT: 10 IU/L (ref 0–44)
AST: 17 IU/L (ref 0–40)
Albumin/Globulin Ratio: 1.7 (ref 1.2–2.2)
Albumin: 4.3 g/dL (ref 3.6–4.6)
Alkaline Phosphatase: 106 IU/L (ref 44–121)
BUN/Creatinine Ratio: 13 (ref 10–24)
BUN: 15 mg/dL (ref 8–27)
Bilirubin Total: 0.3 mg/dL (ref 0.0–1.2)
CO2: 27 mmol/L (ref 20–29)
Calcium: 9.3 mg/dL (ref 8.6–10.2)
Chloride: 102 mmol/L (ref 96–106)
Creatinine, Ser: 1.16 mg/dL (ref 0.76–1.27)
GFR calc Af Amer: 67 mL/min/{1.73_m2} (ref >59)
GFR calc non Af Amer: 58 mL/min/{1.73_m2} — ABNORMAL LOW (ref >59)
Globulin, Total: 2.5 g/dL (ref 1.5–4.5)
Glucose: 95 mg/dL (ref 65–99)
Potassium: 5.4 mmol/L — ABNORMAL HIGH (ref 3.5–5.2)
Sodium: 142 mmol/L (ref 134–144)
Total Protein: 6.8 g/dL (ref 6.0–8.5)

## 2020-04-22 LAB — LIPID PANEL
Chol/HDL Ratio: 2.4 ratio (ref 0.0–5.0)
Cholesterol, Total: 146 mg/dL (ref 100–199)
HDL: 61 mg/dL (ref >39)
LDL Chol Calc (NIH): 74 mg/dL (ref 0–99)
Triglycerides: 50 mg/dL (ref 0–149)
VLDL Cholesterol Cal: 11 mg/dL (ref 5–40)

## 2020-04-22 LAB — VITAMIN B12: Vitamin B-12: 626 pg/mL (ref 232–1245)

## 2020-04-22 LAB — VITAMIN D 25 HYDROXY (VIT D DEFICIENCY, FRACTURES): Vit D, 25-Hydroxy: 59.9 ng/mL (ref 30.0–100.0)

## 2020-04-25 NOTE — Progress Notes (Signed)
Hello Marvin Rose,  Your lab result is normal and/or stable.Some minor variations that are not significant are commonly marked abnormal, but do not represent any medical problem for you.  Best regards, Lun Muro, M.D.

## 2020-09-09 ENCOUNTER — Ambulatory Visit (INDEPENDENT_AMBULATORY_CARE_PROVIDER_SITE_OTHER): Payer: 59

## 2020-09-09 DIAGNOSIS — Z0000 Encounter for general adult medical examination without abnormal findings: Secondary | ICD-10-CM | POA: Diagnosis not present

## 2020-09-09 NOTE — Progress Notes (Signed)
MEDICARE ANNUAL WELLNESS VISIT  09/09/2020  Telephone Visit Disclaimer This Medicare AWV was conducted by telephone due to national recommendations for restrictions regarding the COVID-19 Pandemic (e.g. social distancing).  I verified, using two identifiers, that I am speaking with Marvin Rose or their authorized healthcare agent. I discussed the limitations, risks, security, and privacy concerns of performing an evaluation and management service by telephone and the potential availability of an in-person appointment in the future. The patient expressed understanding and agreed to proceed.  Location of Patient: Home Location of Provider (nurse):  [facility]  Subjective:    Marvin Rose is a 85 y.o. male patient of [name], [name], MD who had a Medicare Annual Wellness Visit today via telephone. Marvin Rose is Retired and lives with their daughter. He has seven children who are living and two who are deceased.  He reports that he is socially active and does interact with friends/family regularly. He is minimally physically active and enjoys working around the house, working in his garden, and spending time with family.  Patient Care Team: Mechele Claude, MD as PCP - General (Family Medicine)  Advanced Directives 09/09/2020  Does Patient Have a Medical Advance Directive? No  Would patient like information on creating a medical advance directive? No - Patient declined    Hospital Utilization Over the Past 12 Months: # of hospitalizations or ER visits: 1 # of surgeries: 0  Review of Systems    Patient reports that his overall health is unchanged compared to last year.  History obtained from chart review and the patient  Patient Reported Readings (BP, Pulse, CBG, Weight, etc) none  Pain Assessment Pain : No/denies pain     Current Medications & Allergies (verified) Allergies as of 09/09/2020       Reactions   Demerol  [meperidine Hcl] Rash   Meperidine And Related Rash         Medication List        Accurate as of September 09, 2020  3:32 PM. If you have any questions, ask your nurse or doctor.          Fish Oil 1000 MG Caps Take 2,000 mg by mouth daily.   pravastatin 40 MG tablet Commonly known as: PRAVACHOL Take 1 tablet (40 mg total) by mouth daily.   Thera Tabs Take by mouth.   vitamin B-12 1000 MCG tablet Commonly known as: CYANOCOBALAMIN Take 1,000 mcg by mouth daily.   Vitamin D3 10 MCG (400 UNIT) Caps Take by mouth.        History (reviewed): Past Medical History:  Diagnosis Date   Hyperlipidemia    Past Surgical History:  Procedure Laterality Date   EYE SURGERY     Family History  Problem Relation Age of Onset   Diabetes Mother    Tuberculosis Father    Cancer Brother        brain   Social History   Socioeconomic History   Marital status: Married    Spouse name: Not on file   Number of children: Not on file   Years of education: Not on file   Highest education level: Not on file  Occupational History   Not on file  Tobacco Use   Smoking status: Every Day    Packs/day: 0.50    Years: 74.00    Pack years: 37.00    Types: Cigarettes   Smokeless tobacco: Never   Tobacco comments:    1/2 pack smoked daily  Substance  and Sexual Activity   Alcohol use: Not Currently   Drug use: Never   Sexual activity: Not on file  Other Topics Concern   Not on file  Social History Narrative   Not on file   Social Determinants of Health   Financial Resource Strain: Not on file  Food Insecurity: Not on file  Transportation Needs: Not on file  Physical Activity: Not on file  Stress: Not on file  Social Connections: Not on file    Activities of Daily Living In your present state of health, do you have any difficulty performing the following activities: 09/09/2020  Hearing? N  Vision? N  Difficulty concentrating or making decisions? N  Walking or climbing stairs? N  Dressing or bathing? N  Doing errands, shopping? N   Preparing Food and eating ? N  Using the Toilet? N  In the past six months, have you accidently leaked urine? N  Do you have problems with loss of bowel control? N  Managing your Finances? N  Housekeeping or managing your Housekeeping? N  Some recent data might be hidden    Patient Education/ Literacy How often do you need to have someone help you when you read instructions, pamphlets, or other written materials from your doctor or pharmacy?: 1 - Never What is the last grade level you completed in school?: 9th grade  Exercise Current Exercise Habits: The patient does not participate in regular exercise at present, Exercise limited by: None identified  Diet Patient reports consuming 3 meals a day and 1 snack(s) a day Patient reports that his primary diet is: Regular Patient reports that he does have regular access to food.   Depression Screen PHQ 2/9 Scores 09/09/2020 04/21/2020 09/30/2019 09/09/2018  PHQ - 2 Score 0 0 0 0     Fall Risk Fall Risk  09/09/2020 04/21/2020 09/30/2019 09/09/2018  Falls in the past year? 0 0 0 0  Number falls in past yr: - - 0 -  Injury with Fall? - - 0 -  Risk for fall due to : - - No Fall Risks -  Follow up Falls evaluation completed Falls evaluation completed Falls evaluation completed -     Objective:  Marvin Rose seemed alert and oriented and he participated appropriately during our telephone visit.  Blood Pressure Weight BMI  BP Readings from Last 3 Encounters:  04/21/20 139/82  09/30/19 (!) 134/84  09/09/18 133/83   Wt Readings from Last 3 Encounters:  04/21/20 137 lb 8 oz (62.4 kg)  09/30/19 136 lb (61.7 kg)  09/09/18 140 lb 6.4 oz (63.7 kg)   BMI Readings from Last 1 Encounters:  04/21/20 19.18 kg/m    *Unable to obtain current vital signs, weight, and BMI due to telephone visit type  Hearing/Vision  Marvin Rose did not seem to have difficulty with hearing/understanding during the telephone conversation Reports that he has not had a  formal eye exam by an eye care professional within the past year Reports that he has not had a formal hearing evaluation within the past year *Unable to fully assess hearing and vision during telephone visit type  Cognitive Function: 6CIT Screen 09/09/2020  What Year? 0 points  What month? 0 points  What time? 0 points  Count back from 20 0 points  Months in reverse 4 points  Repeat phrase 2 points  Total Score 6   (Normal:0-7, Significant for Dysfunction: >8)  Normal Cognitive Function Screening: Yes   Immunization & Health Maintenance Record Immunization  History  Administered Date(s) Administered   Moderna Sars-Covid-2 Vaccination 05/08/2019, 06/13/2019, 02/19/2020   Td 09/02/2014    Health Maintenance  Topic Date Due   Zoster Vaccines- Shingrix (1 of 2) Never done   COVID-19 Vaccine (4 - Booster for Moderna series) 06/19/2020   PNA vac Low Risk Adult (1 of 2 - PCV13) 04/21/2021 (Originally 07/10/2002)   INFLUENZA VACCINE  10/04/2020   TETANUS/TDAP  09/01/2024   HPV VACCINES  Aged Out       Assessment  This is a routine wellness examination for [employer].  Health Maintenance: Due or Overdue Health Maintenance Due  Topic Date Due   Zoster Vaccines- Shingrix (1 of 2) Never done   COVID-19 Vaccine (4 - Booster for Moderna series) 06/19/2020    Marvin Rose does not need a referral for Community Assistance: Care Management:   no Social Work:    no Prescription Assistance:  no Nutrition/Diabetes Education:  no   Plan:  Personalized Goals  Goals Addressed             This Visit's Progress    Patient Stated       09/09/2020 AWV Goal: Fall Prevention  Over the next year, patient will decrease their risk for falls by: Using assistive devices, such as a cane or walker, as needed Identifying fall risks within their home and correcting them by: Removing throw rugs Adding handrails to stairs or ramps Removing clutter and keeping a clear pathway  throughout the home Increasing light, especially at night Adding shower handles/bars Raising toilet seat Identifying potential personal risk factors for falls: Medication side effects Incontinence/urgency Vestibular dysfunction Hearing loss Musculoskeletal disorders Neurological disorders Orthostatic hypotension          Personalized Health Maintenance & Screening Recommendations  Pneumococcal vaccine   Lung Cancer Screening Recommended: yes (Low Dose CT Chest recommended if Age 45-80 years, 30 pack-year currently smoking OR have quit w/in past 15 years) Hepatitis C Screening recommended: no HIV Screening recommended: no  Advanced Directives: Written information was not prepared per patient's request.  Referrals & Orders No orders of the defined types were placed in this encounter.   Follow-up Plan Follow-up with Mechele Claude, MD as planned    I have personally reviewed and noted the following in the patient's chart:   Medical and social history Use of alcohol, tobacco or illicit drugs  Current medications and supplements Functional ability and status Nutritional status Physical activity Advanced directives List of other physicians Hospitalizations, surgeries, and ER visits in previous 12 months Vitals Screenings to include cognitive, depression, and falls Referrals and appointments  In addition, I have reviewed and discussed with Marvin Rose certain preventive protocols, quality metrics, and best practice recommendations. A written personalized care plan for preventive services as well as general preventive health recommendations is available and can be mailed to the patient at his request.      Mariam Dollar, LPN    11/06/4266

## 2020-10-19 ENCOUNTER — Ambulatory Visit: Payer: 59 | Admitting: Family Medicine

## 2020-10-25 ENCOUNTER — Telehealth: Payer: Self-pay | Admitting: Family Medicine

## 2020-10-25 NOTE — Telephone Encounter (Signed)
Spoke with patient, appointment scheduled for 10/28/20 at 9:40 am with Dr. Darlyn Read.

## 2020-10-28 ENCOUNTER — Ambulatory Visit: Payer: 59 | Admitting: Family Medicine

## 2020-10-29 ENCOUNTER — Other Ambulatory Visit: Payer: Self-pay

## 2020-10-29 ENCOUNTER — Encounter: Payer: Self-pay | Admitting: Family Medicine

## 2020-10-29 ENCOUNTER — Ambulatory Visit (INDEPENDENT_AMBULATORY_CARE_PROVIDER_SITE_OTHER): Payer: 59 | Admitting: Family Medicine

## 2020-10-29 VITALS — BP 136/75 | HR 74 | Temp 98.0°F | Resp 20 | Ht 71.0 in | Wt 138.0 lb

## 2020-10-29 DIAGNOSIS — K219 Gastro-esophageal reflux disease without esophagitis: Secondary | ICD-10-CM | POA: Diagnosis not present

## 2020-10-29 DIAGNOSIS — K255 Chronic or unspecified gastric ulcer with perforation: Secondary | ICD-10-CM

## 2020-10-29 DIAGNOSIS — N200 Calculus of kidney: Secondary | ICD-10-CM | POA: Diagnosis not present

## 2020-10-29 DIAGNOSIS — N138 Other obstructive and reflux uropathy: Secondary | ICD-10-CM | POA: Diagnosis not present

## 2020-10-29 DIAGNOSIS — Z09 Encounter for follow-up examination after completed treatment for conditions other than malignant neoplasm: Secondary | ICD-10-CM

## 2020-10-29 NOTE — Progress Notes (Signed)
Subjective:  Patient ID: Marvin Rose, male    DOB: [birth year], 85 y.o.   MRN: [id number]  Patient Care Team: Claretta Fraise, MD as PCP - General (Family Medicine)   Chief Complaint:  Hospitalization Follow-up ([place] - 8/17-8/18/22. Abdominal pain )   HPI: Marvin Rose is a 85 y.o. male presenting on 10/29/2020 for Hospitalization Follow-up ([place] - 8/17-8/18/22. Abdominal pain )   Pt presents today for evaluation after discharge from [facility]. He was admitted to [facility] on 10/20/2020 for micro perforation of gastric ulcer and obstructing renal stone. CT revealed a contained gastric perforation and a 5x7 mm obstructive stone in mid right ureter. He was started on PPI and antibiotic therapy. He remained stable during admission. No pain, fever, chills, weakness, or GI symptoms after initiation of medications. Urine output and creatinine remained normal. He was discharged home 10/21/2020 with a follow up with urology scheduled 11/01/2020 and was told to see PCP for referral to GI. He states he has been fine since discharge. No fever, chills, abdominal or flank pain, weakness, confusion, hematuria, or melena. During admission Hgb 12.2, Hct 36.3, RBC 3.82, CO2, 19, glucose 41, and Ca 8.3.     Relevant past medical, surgical, family, and social history reviewed and updated as indicated.  Allergies and medications reviewed and updated. Data reviewed: Chart in Epic.   Past Medical History:  Diagnosis Date   Hyperlipidemia     Past Surgical History:  Procedure Laterality Date   EYE SURGERY      Social History   Socioeconomic History   Marital status: Married    Spouse name: Not on file   Number of children: Not on file   Years of education: Not on file   Highest education level: Not on file  Occupational History   Not on file  Tobacco Use   Smoking status: Every Day    Packs/day: 0.50    Years: 74.00    Pack years: 37.00    Types: Cigarettes    Smokeless tobacco: Never   Tobacco comments:    1/2 pack smoked daily  Substance and Sexual Activity   Alcohol use: Not Currently   Drug use: Never   Sexual activity: Not on file  Other Topics Concern   Not on file  Social History Narrative   Not on file   Social Determinants of Health   Financial Resource Strain: Not on file  Food Insecurity: Not on file  Transportation Needs: Not on file  Physical Activity: Not on file  Stress: Not on file  Social Connections: Not on file  Intimate Partner Violence: Not on file    Outpatient Encounter Medications as of 10/29/2020  Medication Sig   Cholecalciferol (VITAMIN D3) 10 MCG (400 UNIT) CAPS Take by mouth.   Multiple Vitamin (THERA) TABS Take by mouth.   Omega-3 Fatty Acids (FISH OIL) 1000 MG CAPS Take 2,000 mg by mouth daily.   pravastatin (PRAVACHOL) 40 MG tablet Take 1 tablet (40 mg total) by mouth daily.   vitamin B-12 (CYANOCOBALAMIN) 1000 MCG tablet Take 1,000 mcg by mouth daily.   amoxicillin-clavulanate (AUGMENTIN) 875-125 MG tablet Take 1 tablet by mouth 2 (two) times daily.   pantoprazole (PROTONIX) 40 MG tablet Take 40 mg by mouth 2 (two) times daily.   No facility-administered encounter medications on file as of 10/29/2020.    Allergies  Allergen Reactions   Demerol  [Meperidine Hcl] Rash   Meperidine And Related Rash  Review of Systems  Constitutional:  Negative for activity change, appetite change, chills, diaphoresis, fatigue, fever and unexpected weight change.  HENT: Negative.    Eyes: Negative.   Respiratory:  Negative for cough, chest tightness and shortness of breath.   Cardiovascular:  Negative for chest pain, palpitations and leg swelling.  Gastrointestinal:  Negative for abdominal distention, abdominal pain, anal bleeding, blood in stool, constipation, diarrhea, nausea, rectal pain and vomiting.  Endocrine: Negative.   Genitourinary:  Negative for decreased urine volume, difficulty urinating, dysuria,  enuresis, flank pain, frequency, hematuria, penile discharge, penile pain, penile swelling, scrotal swelling, testicular pain and urgency.  Musculoskeletal:  Negative for arthralgias and myalgias.  Skin: Negative.   Allergic/Immunologic: Negative.   Neurological:  Negative for dizziness, weakness and headaches.  Hematological: Negative.   Psychiatric/Behavioral:  Negative for confusion, hallucinations, sleep disturbance and suicidal ideas.   All other systems reviewed and are negative.      Objective:  BP 136/75   Pulse 74   Temp 98 F (36.7 C)   Resp 20   Ht $R'5\' 11"'XV$  (1.803 m)   Wt 138 lb (62.6 kg)   SpO2 96%   BMI 19.25 kg/m    Wt Readings from Last 3 Encounters:  10/29/20 138 lb (62.6 kg)  04/21/20 137 lb 8 oz (62.4 kg)  09/30/19 136 lb (61.7 kg)    Physical Exam Vitals and nursing note reviewed.  Constitutional:      General: He is not in acute distress.    Appearance: Normal appearance. He is well-developed and well-groomed. He is not ill-appearing, toxic-appearing or diaphoretic.  HENT:     Head: Normocephalic and atraumatic.     Jaw: There is normal jaw occlusion.     Right Ear: Hearing normal.     Left Ear: Hearing normal.     Nose: Nose normal.     Mouth/Throat:     Lips: Pink.     Mouth: Mucous membranes are moist.     Pharynx: Oropharynx is clear. Uvula midline.  Eyes:     General: Lids are normal.     Extraocular Movements: Extraocular movements intact.     Conjunctiva/sclera: Conjunctivae normal.     Pupils: Pupils are equal, round, and reactive to light.  Neck:     Thyroid: No thyroid mass, thyromegaly or thyroid tenderness.     Vascular: No carotid bruit or JVD.     Trachea: Trachea and phonation normal.  Cardiovascular:     Rate and Rhythm: Normal rate and regular rhythm.     Chest Wall: PMI is not displaced.     Pulses: Normal pulses.     Heart sounds: Normal heart sounds. No murmur heard.   No friction rub. No gallop.  Pulmonary:     Effort:  Pulmonary effort is normal. No respiratory distress.     Breath sounds: Normal breath sounds. No wheezing.  Abdominal:     General: Bowel sounds are normal. There is no distension or abdominal bruit.     Palpations: Abdomen is soft. There is no hepatomegaly, splenomegaly or mass.     Tenderness: There is no abdominal tenderness. There is no right CVA tenderness, left CVA tenderness, guarding or rebound.     Hernia: No hernia is present.  Musculoskeletal:        General: Normal range of motion.     Cervical back: Normal range of motion and neck supple.     Right lower leg: No edema.  Left lower leg: No edema.  Lymphadenopathy:     Cervical: No cervical adenopathy.  Skin:    General: Skin is warm and dry.     Capillary Refill: Capillary refill takes less than 2 seconds.     Coloration: Skin is not cyanotic, jaundiced or pale.     Findings: No rash.  Neurological:     General: No focal deficit present.     Mental Status: He is alert and oriented to person, place, and time.     Cranial Nerves: Cranial nerves are intact. No cranial nerve deficit.     Sensory: Sensation is intact. No sensory deficit.     Motor: Motor function is intact. No weakness.     Coordination: Coordination is intact. Coordination normal.     Gait: Gait is intact. Gait normal.     Deep Tendon Reflexes: Reflexes are normal and symmetric. Reflexes normal.  Psychiatric:        Attention and Perception: Attention and perception normal.        Mood and Affect: Mood and affect normal.        Speech: Speech normal.        Behavior: Behavior normal. Behavior is cooperative.        Thought Content: Thought content normal.        Cognition and Memory: Cognition and memory normal.        Judgment: Judgment normal.    Results for orders placed or performed in visit on 04/21/20  Lipid panel  Result Value Ref Range   Cholesterol, Total 146 100 - 199 mg/dL   Triglycerides 50 0 - 149 mg/dL   HDL 61 >39 mg/dL   VLDL  Cholesterol Cal 11 5 - 40 mg/dL   LDL Chol Calc (NIH) 74 0 - 99 mg/dL   Chol/HDL Ratio 2.4 0.0 - 5.0 ratio  CBC with Differential/Platelet  Result Value Ref Range   WBC 4.6 3.4 - 10.8 x10E3/uL   RBC 4.46 4.14 - 5.80 x10E6/uL   Hemoglobin 13.9 13.0 - 17.7 g/dL   Hematocrit 42.8 37.5 - 51.0 %   MCV 96 79 - 97 fL   MCH 31.2 26.6 - 33.0 pg   MCHC 32.5 31.5 - 35.7 g/dL   RDW 12.2 11.6 - 15.4 %   Platelets 221 150 - 450 x10E3/uL   Neutrophils 55 Not Estab. %   Lymphs 31 Not Estab. %   Monocytes 11 Not Estab. %   Eos 2 Not Estab. %   Basos 1 Not Estab. %   Neutrophils Absolute 2.5 1.4 - 7.0 x10E3/uL   Lymphocytes Absolute 1.4 0.7 - 3.1 x10E3/uL   Monocytes Absolute 0.5 0.1 - 0.9 x10E3/uL   EOS (ABSOLUTE) 0.1 0.0 - 0.4 x10E3/uL   Basophils Absolute 0.0 0.0 - 0.2 x10E3/uL   Immature Granulocytes 0 Not Estab. %   Immature Grans (Abs) 0.0 0.0 - 0.1 x10E3/uL  CMP14+EGFR  Result Value Ref Range   Glucose 95 65 - 99 mg/dL   BUN 15 8 - 27 mg/dL   Creatinine, Ser 1.16 0.76 - 1.27 mg/dL   GFR calc non Af Amer 58 (L) >59 mL/min/1.73   GFR calc Af Amer 67 >59 mL/min/1.73   BUN/Creatinine Ratio 13 10 - 24   Sodium 142 134 - 144 mmol/L   Potassium 5.4 (H) 3.5 - 5.2 mmol/L   Chloride 102 96 - 106 mmol/L   CO2 27 20 - 29 mmol/L   Calcium 9.3 8.6 - 10.2 mg/dL  Total Protein 6.8 6.0 - 8.5 g/dL   Albumin 4.3 3.6 - 4.6 g/dL   Globulin, Total 2.5 1.5 - 4.5 g/dL   Albumin/Globulin Ratio 1.7 1.2 - 2.2   Bilirubin Total 0.3 0.0 - 1.2 mg/dL   Alkaline Phosphatase 106 44 - 121 IU/L   AST 17 0 - 40 IU/L   ALT 10 0 - 44 IU/L  VITAMIN D 25 Hydroxy (Vit-D Deficiency, Fractures)  Result Value Ref Range   Vit D, 25-Hydroxy 59.9 30.0 - 100.0 ng/mL  Vitamin B12  Result Value Ref Range   Vitamin B-12 626 232 - 1,245 pg/mL       Pertinent labs & imaging results that were available during my care of the patient were reviewed by me and considered in my medical decision making.  Assessment & Plan:   Marvin Rose was seen today for hospitalization follow-up.  Diagnoses and all orders for this visit:  Hospital discharge follow-up Has been doing very well since discharge home. He is staying with his daughter. Taking medications as prescribed. Will recheck below labs. Referral placed to GI. He has an urology appointment Monday.  -     CBC with Differential/Platelet -     CMP14+EGFR  Gastroesophageal reflux disease without esophagitis Gastric perforation (HCC) Asymptomatic. Has been taking PPI and antibiotic therapy as prescribed. Will place referral to GI today. Pt aware of signs which require emergent evaluation. Will recheck CBC and CMP today.  -     Ambulatory referral to Gastroenterology -     CBC with Differential/Platelet -     CMP14+EGFR  Urinary tract obstruction due to kidney stone Reports adequate urine output at home. No flank pain, fever, chills, weakness, or confusion. Has appointment with urology 11/01/2020. Will check creatinine today. Pt aware of signs which require emergent evaluation and treatment.   Continue all other maintenance medications.  Follow up plan: Return in about 2 months (around 12/29/2020), or if symptoms worsen or fail to improve, for PCP .   Continue healthy lifestyle choices, including diet (rich in fruits, vegetables, and lean proteins, and low in salt and simple carbohydrates) and exercise (at least 30 minutes of moderate physical activity daily).  Educational handout given for kidney stones  The above assessment and management plan was discussed with the patient. The patient verbalized understanding of and has agreed to the management plan. Patient is aware to call the clinic if they develop any new symptoms or if symptoms persist or worsen. Patient is aware when to return to the clinic for a follow-up visit. Patient educated on when it is appropriate to go to the emergency department.   Monia Pouch, FNP-C [facility] [phone]

## 2020-10-30 LAB — CBC WITH DIFFERENTIAL/PLATELET
Basophils Absolute: 0 10*3/uL (ref 0.0–0.2)
Basos: 1 %
EOS (ABSOLUTE): 0.1 10*3/uL (ref 0.0–0.4)
Eos: 3 %
Hematocrit: 40.7 % (ref 37.5–51.0)
Hemoglobin: 13 g/dL (ref 13.0–17.7)
Immature Grans (Abs): 0 10*3/uL (ref 0.0–0.1)
Immature Granulocytes: 0 %
Lymphocytes Absolute: 1.7 10*3/uL (ref 0.7–3.1)
Lymphs: 34 %
MCH: 31.1 pg (ref 26.6–33.0)
MCHC: 31.9 g/dL (ref 31.5–35.7)
MCV: 97 fL (ref 79–97)
Monocytes Absolute: 0.6 10*3/uL (ref 0.1–0.9)
Monocytes: 11 %
Neutrophils Absolute: 2.6 10*3/uL (ref 1.4–7.0)
Neutrophils: 51 %
Platelets: 258 10*3/uL (ref 150–450)
RBC: 4.18 x10E6/uL (ref 4.14–5.80)
RDW: 12.6 % (ref 11.6–15.4)
WBC: 5.1 10*3/uL (ref 3.4–10.8)

## 2020-10-30 LAB — CMP14+EGFR
ALT: 12 IU/L (ref 0–44)
AST: 17 IU/L (ref 0–40)
Albumin/Globulin Ratio: 1.7 (ref 1.2–2.2)
Albumin: 4.2 g/dL (ref 3.6–4.6)
Alkaline Phosphatase: 89 IU/L (ref 44–121)
BUN/Creatinine Ratio: 16 (ref 10–24)
BUN: 17 mg/dL (ref 8–27)
Bilirubin Total: 0.3 mg/dL (ref 0.0–1.2)
CO2: 28 mmol/L (ref 20–29)
Calcium: 9.3 mg/dL (ref 8.6–10.2)
Chloride: 101 mmol/L (ref 96–106)
Creatinine, Ser: 1.08 mg/dL (ref 0.76–1.27)
Globulin, Total: 2.5 g/dL (ref 1.5–4.5)
Glucose: 96 mg/dL (ref 65–99)
Potassium: 4.8 mmol/L (ref 3.5–5.2)
Sodium: 141 mmol/L (ref 134–144)
Total Protein: 6.7 g/dL (ref 6.0–8.5)
eGFR: 68 mL/min/{1.73_m2} (ref >59)

## 2020-11-01 ENCOUNTER — Encounter: Payer: Self-pay | Admitting: Family Medicine

## 2020-12-30 ENCOUNTER — Ambulatory Visit (INDEPENDENT_AMBULATORY_CARE_PROVIDER_SITE_OTHER): Payer: 59 | Admitting: Family Medicine

## 2020-12-30 ENCOUNTER — Encounter: Payer: Self-pay | Admitting: Family Medicine

## 2020-12-30 ENCOUNTER — Other Ambulatory Visit: Payer: Self-pay

## 2020-12-30 VITALS — BP 137/70 | HR 67 | Temp 97.7°F | Ht 71.0 in | Wt 140.0 lb

## 2020-12-30 DIAGNOSIS — E782 Mixed hyperlipidemia: Secondary | ICD-10-CM

## 2020-12-30 DIAGNOSIS — K219 Gastro-esophageal reflux disease without esophagitis: Secondary | ICD-10-CM | POA: Diagnosis not present

## 2020-12-30 NOTE — Progress Notes (Signed)
Subjective:  Patient ID: Marvin Rose, male    DOB: 05-06-1935  Age: 85 y.o. MRN: [id number]  CC: Medical Management of Chronic Issues   HPI Marvin Rose presents for  in for follow-up of elevated cholesterol. Doing well without complaints on current medication. Denies side effects of statin including myalgia and arthralgia and nausea. Currently no chest pain, shortness of breath or other cardiovascular related symptoms noted.  Patient in for follow-up of GERD. Currently asymptomatic taking  PPI daily. There is no chest pain or heartburn. No hematemesis and no melena. No dysphagia or choking. Onset is remote. Progression is stable. Complicating factors, none. In September he developed a gastric ulcer. EGD performed through [facility]. Report via Care everywhere confirms dx, but procedure note not available.   He has a kiidney stone that is to be removed on 11/10. It is causing some RUQ to flank pain. He has hydrocodone per Urology, ut using it minimally.     Depression screen [facility] 2/9 12/30/2020 10/29/2020 09/09/2020  Decreased Interest 0 0 0  Down, Depressed, Hopeless 0 0 0  PHQ - 2 Score 0 0 0    History Marvin Rose has a past medical history of Hyperlipidemia.   He has a past surgical history that includes Eye surgery.   His family history includes Cancer in his brother; Diabetes in his mother; Tuberculosis in his father.He reports that he has been smoking cigarettes. He has a 37.00 pack-year smoking history. He has never used smokeless tobacco. He reports that he does not currently use alcohol. He reports that he does not use drugs.    ROS Review of Systems  Constitutional:  Negative for fever.  Respiratory:  Negative for shortness of breath.   Cardiovascular:  Negative for chest pain.  Musculoskeletal:  Negative for arthralgias.  Skin:  Negative for rash.   Objective:  BP 137/70   Pulse 67   Temp 97.7 F (36.5 C)   Ht _0  (1.803 m)   Wt 140 lb (63.5 kg)    SpO2 96%   BMI 19.53 kg/m   BP Readings from Last 3 Encounters:  12/30/20 137/70  10/29/20 136/75  04/21/20 139/82    Wt Readings from Last 3 Encounters:  12/30/20 140 lb (63.5 kg)  10/29/20 138 lb (62.6 kg)  04/21/20 137 lb 8 oz (62.4 kg)     Physical Exam Constitutional:      General: He is not in acute distress.    Appearance: He is well-developed.  HENT:     Head: Normocephalic and atraumatic.     Right Ear: External ear normal.     Left Ear: External ear normal.     Nose: Nose normal.  Eyes:     Conjunctiva/sclera: Conjunctivae normal.     Pupils: Pupils are equal, round, and reactive to light.  Cardiovascular:     Rate and Rhythm: Normal rate and regular rhythm.     Heart sounds: Normal heart sounds. No murmur heard. Pulmonary:     Effort: Pulmonary effort is normal. No respiratory distress.     Breath sounds: Normal breath sounds. No wheezing or rales.  Abdominal:     Palpations: Abdomen is soft.     Tenderness: There is no abdominal tenderness.  Musculoskeletal:        General: Normal range of motion.     Cervical back: Normal range of motion and neck supple.  Skin:    General: Skin is warm and dry.  Neurological:  Mental Status: He is alert and oriented to person, place, and time.     Deep Tendon Reflexes: Reflexes are normal and symmetric.  Psychiatric:        Behavior: Behavior normal.        Thought Content: Thought content normal.        Judgment: Judgment normal.      Assessment & Plan:   Marvin Rose was seen today for medical management of chronic issues.  Diagnoses and all orders for this visit:  Mixed hyperlipidemia -     CBC with Differential/Platelet -     CMP14+EGFR -     Lipid panel  Gastroesophageal reflux disease without esophagitis -     CBC with Differential/Platelet      I have discontinued Marvin Rose's amoxicillin-clavulanate. I am also having him maintain his Thera, Vitamin D3, pravastatin, Fish Oil, vitamin  B-12, pantoprazole, tamsulosin, and HYDROcodone-acetaminophen.  Allergies as of 12/30/2020       Reactions   Demerol  [meperidine Hcl] Rash   Meperidine And Related Rash        Medication List        Accurate as of December 30, 2020  1:56 PM. If you have any questions, ask your nurse or doctor.          STOP taking these medications    amoxicillin-clavulanate 875-125 MG tablet Commonly known as: AUGMENTIN Stopped by: Claretta Fraise, MD       TAKE these medications    Fish Oil 1000 MG Caps Take 2,000 mg by mouth daily.   HYDROcodone-acetaminophen 5-325 MG tablet Commonly known as: NORCO/VICODIN Take 1 tablet by mouth every 6 (six) hours as needed for moderate pain.   pantoprazole 40 MG tablet Commonly known as: PROTONIX Take 40 mg by mouth 2 (two) times daily.   pravastatin 40 MG tablet Commonly known as: PRAVACHOL Take 1 tablet (40 mg total) by mouth daily.   tamsulosin 0.4 MG Caps capsule Commonly known as: FLOMAX Take 0.4 mg by mouth.   Thera Tabs Take by mouth.   vitamin B-12 1000 MCG tablet Commonly known as: CYANOCOBALAMIN Take 1,000 mcg by mouth daily.   Vitamin D3 10 MCG (400 UNIT) Caps Take by mouth.         Follow-up: Return in about 6 months (around 06/30/2021).  Claretta Fraise, M.D.

## 2020-12-31 LAB — CBC WITH DIFFERENTIAL/PLATELET
Basophils Absolute: 0 10*3/uL (ref 0.0–0.2)
Basos: 0 %
EOS (ABSOLUTE): 0.1 10*3/uL (ref 0.0–0.4)
Eos: 2 %
Hematocrit: 40.7 % (ref 37.5–51.0)
Hemoglobin: 13.4 g/dL (ref 13.0–17.7)
Immature Grans (Abs): 0 10*3/uL (ref 0.0–0.1)
Immature Granulocytes: 0 %
Lymphocytes Absolute: 1.5 10*3/uL (ref 0.7–3.1)
Lymphs: 29 %
MCH: 31.5 pg (ref 26.6–33.0)
MCHC: 32.9 g/dL (ref 31.5–35.7)
MCV: 96 fL (ref 79–97)
Monocytes Absolute: 0.5 10*3/uL (ref 0.1–0.9)
Monocytes: 10 %
Neutrophils Absolute: 3.1 10*3/uL (ref 1.4–7.0)
Neutrophils: 59 %
Platelets: 205 10*3/uL (ref 150–450)
RBC: 4.26 x10E6/uL (ref 4.14–5.80)
RDW: 12.3 % (ref 11.6–15.4)
WBC: 5.2 10*3/uL (ref 3.4–10.8)

## 2020-12-31 LAB — CMP14+EGFR
ALT: 8 IU/L (ref 0–44)
AST: 15 IU/L (ref 0–40)
Albumin/Globulin Ratio: 1.4 (ref 1.2–2.2)
Albumin: 4.2 g/dL (ref 3.6–4.6)
Alkaline Phosphatase: 110 IU/L (ref 44–121)
BUN/Creatinine Ratio: 16 (ref 10–24)
BUN: 18 mg/dL (ref 8–27)
Bilirubin Total: 0.2 mg/dL (ref 0.0–1.2)
CO2: 27 mmol/L (ref 20–29)
Calcium: 9.5 mg/dL (ref 8.6–10.2)
Chloride: 103 mmol/L (ref 96–106)
Creatinine, Ser: 1.13 mg/dL (ref 0.76–1.27)
Globulin, Total: 2.9 g/dL (ref 1.5–4.5)
Glucose: 92 mg/dL (ref 70–99)
Potassium: 4.6 mmol/L (ref 3.5–5.2)
Sodium: 142 mmol/L (ref 134–144)
Total Protein: 7.1 g/dL (ref 6.0–8.5)
eGFR: 64 mL/min/{1.73_m2} (ref >59)

## 2020-12-31 LAB — LIPID PANEL
Chol/HDL Ratio: 2.4 ratio (ref 0.0–5.0)
Cholesterol, Total: 155 mg/dL (ref 100–199)
HDL: 65 mg/dL (ref >39)
LDL Chol Calc (NIH): 80 mg/dL (ref 0–99)
Triglycerides: 45 mg/dL (ref 0–149)
VLDL Cholesterol Cal: 10 mg/dL (ref 5–40)

## 2021-01-01 NOTE — Progress Notes (Signed)
Hello Jahad,  Your lab result is normal and/or stable.Some minor variations that are not significant are commonly marked abnormal, but do not represent any medical problem for you.  Best regards, Navon Kotowski, M.D.

## 2021-01-10 ENCOUNTER — Other Ambulatory Visit: Payer: Self-pay | Admitting: Family Medicine

## 2021-01-10 DIAGNOSIS — E782 Mixed hyperlipidemia: Secondary | ICD-10-CM

## 2021-04-14 ENCOUNTER — Encounter: Payer: Self-pay | Admitting: *Deleted

## 2021-06-29 ENCOUNTER — Ambulatory Visit (INDEPENDENT_AMBULATORY_CARE_PROVIDER_SITE_OTHER): Payer: 59 | Admitting: Family Medicine

## 2021-06-29 ENCOUNTER — Encounter: Payer: Self-pay | Admitting: Family Medicine

## 2021-06-29 VITALS — BP 138/76 | HR 77 | Temp 97.7°F | Ht 71.0 in | Wt 138.4 lb

## 2021-06-29 DIAGNOSIS — K219 Gastro-esophageal reflux disease without esophagitis: Secondary | ICD-10-CM | POA: Diagnosis not present

## 2021-06-29 DIAGNOSIS — E538 Deficiency of other specified B group vitamins: Secondary | ICD-10-CM | POA: Diagnosis not present

## 2021-06-29 DIAGNOSIS — E559 Vitamin D deficiency, unspecified: Secondary | ICD-10-CM | POA: Diagnosis not present

## 2021-06-29 DIAGNOSIS — E782 Mixed hyperlipidemia: Secondary | ICD-10-CM

## 2021-06-29 MED ORDER — PRAVASTATIN SODIUM 40 MG PO TABS: 40.0000 mg | ORAL_TABLET | Freq: Every day | ORAL | 1 refills | Status: DC

## 2021-06-29 NOTE — Progress Notes (Signed)
? ?Subjective:  ?Patient ID: Marvin Rose, male    DOB: Jun 15, 1935  Age: 86 y.o. MRN: [id number] ? ?CC: Medical Management of Chronic Issues ? ? ?HPI ?[name] presents for follow-up of elevated cholesterol. Doing well without complaints on current medication. Denies side effects of statin including myalgia and arthralgia and nausea. Also in today for liver function testing. Currently no chest pain, shortness of breath or other cardiovascular related symptoms noted. ? ?Patient in for follow-up of GERD. Currently asymptomatic taking  PPI daily. There is no chest pain or heartburn. No hematemesis and no melena. No dysphagia or choking. Onset is remote. Progression is stable. Complicating factors, none. ? ? ? ?History ?Daelen has a past medical history of Hyperlipidemia.  ? ?He has a past surgical history that includes Eye surgery and Kidney stone surgery.  ? ?His family history includes Cancer in his brother; Diabetes in his mother; Tuberculosis in his father.He reports that he has been smoking cigarettes. He has a 37.00 pack-year smoking history. He has never used smokeless tobacco. He reports that he does not currently use alcohol. He reports that he does not use drugs. ? ?Current Outpatient Medications on File Prior to Visit  ?Medication Sig Dispense Refill  ? Cholecalciferol (VITAMIN D3) 10 MCG (400 UNIT) CAPS Take by mouth.    ? HYDROcodone-acetaminophen (NORCO/VICODIN) 5-325 MG tablet Take 1 tablet by mouth every 6 (six) hours as needed for moderate pain.    ? Multiple Vitamin (THERA) TABS Take by mouth.    ? Omega-3 Fatty Acids (FISH OIL) 1000 MG CAPS Take 2,000 mg by mouth daily.    ? pantoprazole (PROTONIX) 40 MG tablet Take 40 mg by mouth 2 (two) times daily.    ? tamsulosin (FLOMAX) 0.4 MG CAPS capsule Take 0.4 mg by mouth.    ? vitamin B-12 (CYANOCOBALAMIN) 1000 MCG tablet Take 1,000 mcg by mouth daily.    ? ?No current facility-administered medications on file prior to visit.  ? ? ?ROS ?Review  of Systems  ?Constitutional:  Negative for fever.  ?Respiratory:  Negative for shortness of breath.   ?Cardiovascular:  Negative for chest pain.  ?Musculoskeletal:  Negative for arthralgias.  ?Skin:  Negative for rash.  ? ?Objective:  ?BP 138/76   Pulse 77   Temp 97.7 ?F (36.5 ?C)   Ht $R'5\' 11"'PH$  (1.803 m)   Wt 138 lb 6.4 oz (62.8 kg)   SpO2 96%   BMI 19.30 kg/m?  ? ?BP Readings from Last 3 Encounters:  ?06/29/21 138/76  ?12/30/20 137/70  ?10/29/20 136/75  ? ? ?Wt Readings from Last 3 Encounters:  ?06/29/21 138 lb 6.4 oz (62.8 kg)  ?12/30/20 140 lb (63.5 kg)  ?10/29/20 138 lb (62.6 kg)  ? ? ? ?Physical Exam ?Constitutional:   ?   General: He is not in acute distress. ?   Appearance: He is well-developed.  ?HENT:  ?   Head: Normocephalic and atraumatic.  ?   Right Ear: External ear normal.  ?   Left Ear: External ear normal.  ?   Nose: Nose normal.  ?Eyes:  ?   Conjunctiva/sclera: Conjunctivae normal.  ?   Pupils: Pupils are equal, round, and reactive to light.  ?Cardiovascular:  ?   Rate and Rhythm: Normal rate and regular rhythm.  ?   Heart sounds: Normal heart sounds. No murmur heard. ?Pulmonary:  ?   Effort: Pulmonary effort is normal. No respiratory distress.  ?   Breath sounds: Normal breath sounds.  No wheezing or rales.  ?Abdominal:  ?   Palpations: Abdomen is soft.  ?   Tenderness: There is no abdominal tenderness.  ?Musculoskeletal:     ?   General: Normal range of motion.  ?   Cervical back: Normal range of motion and neck supple.  ?Skin: ?   General: Skin is warm and dry.  ?Neurological:  ?   Mental Status: He is alert and oriented to person, place, and time.  ?   Deep Tendon Reflexes: Reflexes are normal and symmetric.  ?Psychiatric:     ?   Behavior: Behavior normal.     ?   Thought Content: Thought content normal.     ?   Judgment: Judgment normal.  ? ? ?No results found for: HGBA1C ? ?Lab Results  ?Component Value Date  ? WBC 5.8 06/29/2021  ? HGB 14.2 06/29/2021  ? HCT 41.8 06/29/2021  ? PLT 204  06/29/2021  ? GLUCOSE 84 06/29/2021  ? CHOL 162 06/29/2021  ? TRIG 50 06/29/2021  ? HDL 69 06/29/2021  ? [place] 83 06/29/2021  ? ALT 13 06/29/2021  ? AST 17 06/29/2021  ? NA 147 (H) 06/29/2021  ? K 5.6 (H) 06/29/2021  ? CL 108 (H) 06/29/2021  ? CREATININE 1.08 06/29/2021  ? BUN 17 06/29/2021  ? CO2 24 06/29/2021  ? ? ?No results found. ? ?Assessment & Plan:  ? ?Jaxton was seen today for medical management of chronic issues. ? ?Diagnoses and all orders for this visit: ? ?Mixed hyperlipidemia ?-     CBC with Differential/Platelet ?-     CMP14+EGFR ?-     Lipid panel ?-     pravastatin (PRAVACHOL) 40 MG tablet; Take 1 tablet (40 mg total) by mouth daily. ? ?Gastroesophageal reflux disease without esophagitis ? ?Vitamin D deficiency ? ?Vitamin B12 deficiency ? ? ?I have changed Tana Conch. Liming's pravastatin. I am also having him maintain his Thera, Vitamin D3, Fish Oil, vitamin B-12, pantoprazole, tamsulosin, and HYDROcodone-acetaminophen. ? ?Meds ordered this encounter  ?Medications  ? pravastatin (PRAVACHOL) 40 MG tablet  ?  Sig: Take 1 tablet (40 mg total) by mouth daily.  ?  Dispense:  90 tablet  ?  Refill:  1  ? ? ? ?Follow-up: Return in about 6 months (around 12/29/2021). ? ?Claretta Fraise, M.D. ?

## 2021-06-30 ENCOUNTER — Encounter: Payer: Self-pay | Admitting: Family Medicine

## 2021-06-30 ENCOUNTER — Ambulatory Visit: Payer: 59 | Admitting: Family Medicine

## 2021-06-30 LAB — CMP14+EGFR
ALT: 13 IU/L (ref 0–44)
AST: 17 IU/L (ref 0–40)
Albumin/Globulin Ratio: 1.8 (ref 1.2–2.2)
Albumin: 4.4 g/dL (ref 3.6–4.6)
Alkaline Phosphatase: 117 IU/L (ref 44–121)
BUN/Creatinine Ratio: 16 (ref 10–24)
BUN: 17 mg/dL (ref 8–27)
Bilirubin Total: 0.5 mg/dL (ref 0.0–1.2)
CO2: 24 mmol/L (ref 20–29)
Calcium: 9.6 mg/dL (ref 8.6–10.2)
Chloride: 108 mmol/L — ABNORMAL HIGH (ref 96–106)
Creatinine, Ser: 1.08 mg/dL (ref 0.76–1.27)
Globulin, Total: 2.5 g/dL (ref 1.5–4.5)
Glucose: 84 mg/dL (ref 70–99)
Potassium: 5.6 mmol/L — ABNORMAL HIGH (ref 3.5–5.2)
Sodium: 147 mmol/L — ABNORMAL HIGH (ref 134–144)
Total Protein: 6.9 g/dL (ref 6.0–8.5)
eGFR: 68 mL/min/{1.73_m2} (ref >59)

## 2021-06-30 LAB — CBC WITH DIFFERENTIAL/PLATELET
Basophils Absolute: 0 10*3/uL (ref 0.0–0.2)
Basos: 0 %
EOS (ABSOLUTE): 0.1 10*3/uL (ref 0.0–0.4)
Eos: 2 %
Hematocrit: 41.8 % (ref 37.5–51.0)
Hemoglobin: 14.2 g/dL (ref 13.0–17.7)
Immature Grans (Abs): 0 10*3/uL (ref 0.0–0.1)
Immature Granulocytes: 0 %
Lymphocytes Absolute: 1.5 10*3/uL (ref 0.7–3.1)
Lymphs: 26 %
MCH: 32.1 pg (ref 26.6–33.0)
MCHC: 34 g/dL (ref 31.5–35.7)
MCV: 94 fL (ref 79–97)
Monocytes Absolute: 0.7 10*3/uL (ref 0.1–0.9)
Monocytes: 12 %
Neutrophils Absolute: 3.5 10*3/uL (ref 1.4–7.0)
Neutrophils: 60 %
Platelets: 204 10*3/uL (ref 150–450)
RBC: 4.43 x10E6/uL (ref 4.14–5.80)
RDW: 12.7 % (ref 11.6–15.4)
WBC: 5.8 10*3/uL (ref 3.4–10.8)

## 2021-06-30 LAB — LIPID PANEL
Chol/HDL Ratio: 2.3 ratio (ref 0.0–5.0)
Cholesterol, Total: 162 mg/dL (ref 100–199)
HDL: 69 mg/dL (ref >39)
LDL Chol Calc (NIH): 83 mg/dL (ref 0–99)
Triglycerides: 50 mg/dL (ref 0–149)
VLDL Cholesterol Cal: 10 mg/dL (ref 5–40)

## 2021-06-30 NOTE — Progress Notes (Signed)
Hello Domanique,  Your lab result is normal and/or stable.Some minor variations that are not significant are commonly marked abnormal, but do not represent any medical problem for you.  Best regards, Leaner Morici, M.D.

## 2021-07-11 ENCOUNTER — Encounter: Payer: Self-pay | Admitting: Family Medicine

## 2021-07-11 ENCOUNTER — Ambulatory Visit (INDEPENDENT_AMBULATORY_CARE_PROVIDER_SITE_OTHER): Payer: 59 | Admitting: Family Medicine

## 2021-07-11 VITALS — BP 129/62 | HR 58 | Temp 98.3°F | Ht 71.0 in | Wt 139.0 lb

## 2021-07-11 DIAGNOSIS — R42 Dizziness and giddiness: Secondary | ICD-10-CM | POA: Diagnosis not present

## 2021-07-11 DIAGNOSIS — Z09 Encounter for follow-up examination after completed treatment for conditions other than malignant neoplasm: Secondary | ICD-10-CM

## 2021-07-11 DIAGNOSIS — Z720 Tobacco use: Secondary | ICD-10-CM

## 2021-07-11 NOTE — Progress Notes (Signed)
? ?Established Patient Office Visit ? ?Subjective   ?Patient ID: Marvin Rose, male    DOB: [birth year]  Age: 86 y.o. MRN: [id number] ? ?Chief Complaint  ?Patient presents with  ? ER follow up  ? ? ?HPI ?Marvin Rose reports that Marvin Rose was seen in the ER at [facility] 2 days ago for lightheadedness. Marvin Rose reports that Marvin Rose had been working out in the garden for a few hours that morning. Marvin Rose had not had much to eat that day. Marvin Rose then sat down and smoked 2-3 cigarettes. When Marvin Rose stood up, Marvin Rose felt light Marvin Rose was going to pass out. Marvin Rose was taken to the ER. Marvin Rose reports that in the ER the did a brain scan, labs, urine tests, and heart tests including a EKG and that everything was fine. Marvin Rose reports that Marvin Rose has felt fine since then and has not had any lightheadedness since. Marvin Rose also denies confusion, focal weakness, changes in vision, chest pain, shortness of breath, palpations, falls, LOC, or edema. Marvin Rose does not have any paperwork for records for this visit with him today.  ? ?Past Medical History:  ?Diagnosis Date  ? Hyperlipidemia   ? ? ?ROS ?As per HPI.  ?  ?Objective:  ?  ? ?BP 129/62   Pulse (!) 58   Temp 98.3 ?F (36.8 ?C) (Temporal)   Ht 5\' 11"  (1.803 m)   Wt 139 lb (63 kg)   BMI 19.39 kg/m?  ?BP Readings from Last 3 Encounters:  ?07/11/21 129/62  ?06/29/21 138/76  ?12/30/20 137/70  ? ?  ? ?Physical Exam ?Vitals and nursing note reviewed.  ?Constitutional:   ?   General: Marvin Rose is not in acute distress. ?   Appearance: Marvin Rose is not ill-appearing, toxic-appearing or diaphoretic.  ?HENT:  ?   Head: Normocephalic and atraumatic.  ?   Nose: Nose normal.  ?   Mouth/Throat:  ?   Mouth: Mucous membranes are moist.  ?   Pharynx: Oropharynx is clear.  ?Eyes:  ?   Extraocular Movements: Extraocular movements intact.  ?   Conjunctiva/sclera: Conjunctivae normal.  ?   Pupils: Pupils are equal, round, and reactive to light.  ?Neck:  ?   Thyroid: No thyroid mass, thyromegaly or thyroid tenderness.  ?   Vascular: No JVD.  ?Cardiovascular:  ?   Rate  and Rhythm: Normal rate and regular rhythm.  ?   Heart sounds: Normal heart sounds. No murmur heard. ?Pulmonary:  ?   Effort: Pulmonary effort is normal. No respiratory distress.  ?   Breath sounds: Normal breath sounds.  ?Abdominal:  ?   General: Bowel sounds are normal. There is no distension.  ?   Palpations: Abdomen is soft.  ?   Tenderness: There is no abdominal tenderness. There is no guarding or rebound.  ?Musculoskeletal:  ?   Right lower leg: No edema.  ?   Left lower leg: No edema.  ?Skin: ?   General: Skin is warm and dry.  ?Neurological:  ?   General: No focal deficit present.  ?   Mental Status: Marvin Rose is alert and oriented to person, place, and time.  ?   Cranial Nerves: No cranial nerve deficit.  ?   Sensory: No sensory deficit.  ?   Motor: No weakness.  ?   Coordination: Coordination normal.  ?   Gait: Gait normal.  ?Psychiatric:     ?   Mood and Affect: Mood normal.     ?   Behavior: Behavior normal.     ?  Thought Content: Thought content normal.     ?   Judgment: Judgment normal.  ? ? ? ?No results found for any visits on 07/11/21. ? ? ? ?The ASCVD Risk score (Arnett DK, et al., 2019) failed to calculate for the following reasons: ?  The 2019 ASCVD risk score is only valid for ages 63 to 90 ? ?  ?Assessment & Plan:  ? ?Marvin Rose was seen today for er follow up. ? ?Diagnoses and all orders for this visit: ? ?Lightheadedness ?Unsure of etiology, appears to have resolved. Reports full work up in ER that was negative including EKG, brain scan, blood work, and UA. Denies any symptoms since, benign exam today. Records of ER visit are not available today. We have called medical records at [facility] and left a message regarding this. Will review records when available and address anything that may need to be followed up on. Strict return precautions given today.  ? ?Tobacco abuse disorder ?Not ready to quit.  ? ?Hospital discharge follow-up ?Awaiting hospital records. ? ?Return if symptoms worsen or  fail to improve. ? ?The patient indicates understanding of these issues and agrees with the plan. ? ? ?Gabriel Earing, FNP ? ? ? ?

## 2021-07-11 NOTE — Patient Instructions (Signed)
Dizziness Dizziness is a common problem. It is a feeling of unsteadiness or light-headedness. You may feel like you are about to faint. Dizziness can lead to injury if you stumble or fall. Anyone can become dizzy, but dizziness is more common in older adults. This condition can be caused by a number of things, including medicines, dehydration, or illness. Follow these instructions at home: Eating and drinking  Drink enough fluid to keep your urine pale yellow. This helps to keep you from becoming dehydrated. Try to drink more clear fluids, such as water. Do not drink alcohol. Limit your caffeine intake if told to do so by your health care provider. Check ingredients and nutrition facts to see if a food or beverage contains caffeine. Limit your salt (sodium) intake if told to do so by your health care provider. Check ingredients and nutrition facts to see if a food or beverage contains sodium. Activity  Avoid making quick movements. Rise slowly from chairs and steady yourself until you feel okay. In the morning, first sit up on the side of the bed. When you feel okay, stand slowly while you hold onto something until you know that your balance is good. If you need to stand in one place for a long time, move your legs often. Tighten and relax the muscles in your legs while you are standing. Do not drive or use machinery if you feel dizzy. Avoid bending down if you feel dizzy. Place items in your home so that they are easy for you to reach without leaning over. Lifestyle Do not use any products that contain nicotine or tobacco. These products include cigarettes, chewing tobacco, and vaping devices, such as e-cigarettes. If you need help quitting, ask your health care provider. Try to reduce your stress level by using methods such as yoga or meditation. Talk with your health care provider if you need help to manage your stress. General instructions Watch your dizziness for any changes. Take  over-the-counter and prescription medicines only as told by your health care provider. Talk with your health care provider if you think that your dizziness is caused by a medicine that you are taking. Tell a friend or a family member that you are feeling dizzy. If he or she notices any changes in your behavior, have this person call your health care provider. Keep all follow-up visits. This is important. Contact a health care provider if: Your dizziness does not go away or you have new symptoms. Your dizziness or light-headedness gets worse. You feel nauseous. You have reduced hearing. You have a fever. You have neck pain or a stiff neck. Your dizziness leads to an injury or a fall. Get help right away if: You vomit or have diarrhea and are unable to eat or drink anything. You have problems talking, walking, swallowing, or using your arms, hands, or legs. You feel generally weak. You have any bleeding. You are not thinking clearly or you have trouble forming sentences. It may take a friend or family member to notice this. You have chest pain, abdominal pain, shortness of breath, or sweating. Your vision changes or you develop a severe headache. These symptoms may represent a serious problem that is an emergency. Do not wait to see if the symptoms will go away. Get medical help right away. Call your local emergency services (911 in the [place] Do not drive yourself to the hospital. Summary Dizziness is a feeling of unsteadiness or light-headedness. This condition can be caused by a number of   things, including medicines, dehydration, or illness. Anyone can become dizzy, but dizziness is more common in older adults. Drink enough fluid to keep your urine pale yellow. Do not drink alcohol. Avoid making quick movements if you feel dizzy. Monitor your dizziness for any changes. This information is not intended to replace advice given to you by your health care provider. Make sure you discuss any  questions you have with your health care provider. Document Revised: 01/26/2020 Document Reviewed: 01/26/2020 Elsevier Patient Education  2023 [employer]  

## 2021-08-31 ENCOUNTER — Telehealth: Payer: Self-pay | Admitting: Family Medicine

## 2021-08-31 NOTE — Telephone Encounter (Signed)
Left message for patient to call back and schedule Medicare Annual Wellness Visit (AWV) to be completed by video or phone.   Last AWV: 09/09/2020   Please schedule at anytime with [facility] Nurse Health Advisor     45 minute appointment  Any questions, please contact me at [phone]     

## 2021-11-22 ENCOUNTER — Ambulatory Visit (INDEPENDENT_AMBULATORY_CARE_PROVIDER_SITE_OTHER): Payer: 59

## 2021-11-22 DIAGNOSIS — Z0000 Encounter for general adult medical examination without abnormal findings: Secondary | ICD-10-CM | POA: Diagnosis not present

## 2021-11-22 NOTE — Progress Notes (Signed)
MEDICARE ANNUAL WELLNESS VISIT  11/22/2021  Telephone Visit Disclaimer This Medicare AWV was conducted by telephone due to national recommendations for restrictions regarding the COVID-19 Pandemic (e.g. social distancing).  I verified, using two identifiers, that I am speaking with Marvin Rose or their authorized healthcare agent. I discussed the limitations, risks, security, and privacy concerns of performing an evaluation and management service by telephone and the potential availability of an in-person appointment in the future. The patient expressed understanding and agreed to proceed.  Location of Patient: Home Location of Provider (nurse):  [facility]  Subjective:    Marvin Rose is a 86 y.o. male patient of [name], Cletus Gash, MD who had a Medicare Annual Wellness Visit today via telephone. Gent is Retired and lives alone. He has seven children. He reports that he is socially active and does interact with friends/family regularly. He is minimally physically active and enjoys working in his yard.  Patient Care Team: Claretta Fraise, MD as PCP - General (Family Medicine)     11/22/2021   12:03 PM 09/09/2020    3:27 PM  Advanced Directives  Does Patient Have a Medical Advance Directive? No No  Would patient like information on creating a medical advance directive? No - Patient declined No - Patient declined    Hospital Utilization Over the Past 12 Months: # of hospitalizations or ER visits: 1 # of surgeries: 0  Review of Systems    Patient reports that his overall health is unchanged compared to last year.  History obtained from chart review and the patient  Patient Reported Readings (BP, Pulse, CBG, Weight, etc) none  Pain Assessment Pain : No/denies pain     Current Medications & Allergies (verified) Allergies as of 11/22/2021       Reactions   Demerol  [meperidine Hcl] Rash   Meperidine And Related Rash        Medication List        Accurate as of  November 22, 2021 12:10 PM. If you have any questions, ask your nurse or doctor.          cyanocobalamin 1000 MCG tablet Commonly known as: VITAMIN B12 Take 1,000 mcg by mouth daily.   Fish Oil 1000 MG Caps Take 2,000 mg by mouth daily.   pantoprazole 40 MG tablet Commonly known as: PROTONIX Take 40 mg by mouth 2 (two) times daily.   pravastatin 40 MG tablet Commonly known as: PRAVACHOL Take 1 tablet (40 mg total) by mouth daily.   Thera Tabs Take by mouth.   Vitamin D3 10 MCG (400 UNIT) Caps Take by mouth.        History (reviewed): Past Medical History:  Diagnosis Date   Hyperlipidemia    Past Surgical History:  Procedure Laterality Date   EYE SURGERY     KIDNEY STONE SURGERY     Family History  Problem Relation Age of Onset   Diabetes Mother    Tuberculosis Father    Cancer Brother        brain   Social History   Socioeconomic History   Marital status: Married    Spouse name: Not on file   Number of children: Not on file   Years of education: Not on file   Highest education level: Not on file  Occupational History   Not on file  Tobacco Use   Smoking status: Every Day    Packs/day: 0.50    Years: 74.00    Total pack  years: 37.00    Types: Cigarettes   Smokeless tobacco: Never   Tobacco comments:    1/2 pack smoked daily  Substance and Sexual Activity   Alcohol use: Not Currently   Drug use: Never   Sexual activity: Not on file  Other Topics Concern   Not on file  Social History Narrative   Not on file   Social Determinants of Health   Financial Resource Strain: Not on file  Food Insecurity: Not on file  Transportation Needs: Not on file  Physical Activity: Not on file  Stress: Not on file  Social Connections: Not on file    Activities of Daily Living    11/22/2021   12:03 PM  In your present state of health, do you have any difficulty performing the following activities:  Hearing? 1  Vision? 0  Difficulty concentrating or  making decisions? 0  Walking or climbing stairs? 0  Dressing or bathing? 0  Doing errands, shopping? 0  Preparing Food and eating ? N  Using the Toilet? N  In the past six months, have you accidently leaked urine? N  Do you have problems with loss of bowel control? N  Managing your Medications? N  Managing your Finances? N  Housekeeping or managing your Housekeeping? N    Patient Education/ Literacy How often do you need to have someone help you when you read instructions, pamphlets, or other written materials from your doctor or pharmacy?: 1 - Never What is the last grade level you completed in school?: 9th grade  Exercise Current Exercise Habits: The patient does not participate in regular exercise at present, Exercise limited by: None identified  Diet Patient reports consuming 2 meals a day and 2 snack(s) a day Patient reports that his primary diet is: Regular Patient reports that he does have regular access to food.   Depression Screen    11/22/2021   12:09 PM 07/11/2021    2:59 PM 06/29/2021    3:02 PM 12/30/2020   12:53 PM 10/29/2020   12:01 PM 09/09/2020    3:32 PM 04/21/2020    2:08 PM  PHQ 2/9 Scores  PHQ - 2 Score 0 0 0 0 0 0 0  PHQ- 9 Score  0          Fall Risk    11/22/2021   12:09 PM 07/11/2021    2:58 PM 06/29/2021    3:02 PM 12/30/2020   12:53 PM 10/29/2020   12:01 PM  Fall Risk   Falls in the past year? 0 0 0 0 0  Follow up Falls evaluation completed         Objective:  Marvin Rose seemed alert and oriented and he participated appropriately during our telephone visit.  Blood Pressure Weight BMI  BP Readings from Last 3 Encounters:  07/11/21 129/62  06/29/21 138/76  12/30/20 137/70   Wt Readings from Last 3 Encounters:  07/11/21 139 lb (63 kg)  06/29/21 138 lb 6.4 oz (62.8 kg)  12/30/20 140 lb (63.5 kg)   BMI Readings from Last 1 Encounters:  07/11/21 19.39 kg/m    *Unable to obtain current vital signs, weight, and BMI due to telephone  visit type  Hearing/Vision  [place] did not seem to have difficulty with hearing/understanding during the telephone conversation Reports that he has not had a formal eye exam by an eye care professional within the past year Reports that he has not had a formal hearing evaluation within the past year *  Unable to fully assess hearing and vision during telephone visit type  Cognitive Function:    11/22/2021   12:06 PM 09/09/2020    3:29 PM  6CIT Screen  What Year? 0 points 0 points  What month? 0 points 0 points  What time? 0 points 0 points  Count back from 20 0 points 0 points  Months in reverse 4 points 4 points  Repeat phrase 2 points 2 points  Total Score 6 points 6 points   (Normal:0-7, Significant for Dysfunction: >8)  Normal Cognitive Function Screening: Yes   Immunization & Health Maintenance Record Immunization History  Administered Date(s) Administered   Moderna Sars-Covid-2 Vaccination 05/08/2019, 06/13/2019, 02/19/2020   Td 09/02/2014    Health Maintenance  Topic Date Due   Zoster Vaccines- Shingrix (1 of 2) Never done   COVID-19 Vaccine (4 - Moderna series) 04/15/2020   Pneumonia Vaccine 68+ Years old (1 - PCV) 12/30/2021 (Originally 07/10/1941)   INFLUENZA VACCINE  06/04/2022 (Originally 10/04/2021)   TETANUS/TDAP  09/01/2024   HPV VACCINES  Aged Out       Assessment  This is a routine wellness examination for [employer].  Health Maintenance: Due or Overdue Health Maintenance Due  Topic Date Due   Zoster Vaccines- Shingrix (1 of 2) Never done   COVID-19 Vaccine (4 - Moderna series) 04/15/2020    Marvin Rose does not need a referral for Community Assistance: Care Management:   no Social Work:    no Prescription Assistance:  no Nutrition/Diabetes Education:  no   Plan:  Personalized Goals  Goals Addressed             This Visit's Progress    Patient Stated       11/22/2021 AWV Goal: Fall Prevention  Over the next year, patient will  decrease their risk for falls by: Using assistive devices, such as a cane or walker, as needed Identifying fall risks within their home and correcting them by: Removing throw rugs Adding handrails to stairs or ramps Removing clutter and keeping a clear pathway throughout the home Increasing light, especially at night Adding shower handles/bars Raising toilet seat Identifying potential personal risk factors for falls: Medication side effects Incontinence/urgency Vestibular dysfunction Hearing loss Musculoskeletal disorders Neurological disorders Orthostatic hypotension         Personalized Health Maintenance & Screening Recommendations  Pneumococcal vaccine  Influenza vaccine Shingrix vaccine  Lung Cancer Screening Recommended: yes (Low Dose CT Chest recommended if Age 58-80 years, 30 pack-year currently smoking OR have quit w/in past 15 years) Hepatitis C Screening recommended: no HIV Screening recommended: no  Advanced Directives: Written information was not prepared per patient's request.  Referrals & Orders No orders of the defined types were placed in this encounter.   Follow-up Plan Follow-up with Claretta Fraise, MD as planned   I have personally reviewed and noted the following in the patient's chart:   Medical and social history Use of alcohol, tobacco or illicit drugs  Current medications and supplements Functional ability and status Nutritional status Physical activity Advanced directives List of other physicians Hospitalizations, surgeries, and ER visits in previous 12 months Vitals Screenings to include cognitive, depression, and falls Referrals and appointments  In addition, I have reviewed and discussed with Marvin Rose certain preventive protocols, quality metrics, and best practice recommendations. A written personalized care plan for preventive services as well as general preventive health recommendations is available and can be mailed to  the patient at his request.  Burnadette Pop  11/22/2021   Patient declines after visit summary.

## 2022-01-02 ENCOUNTER — Ambulatory Visit (INDEPENDENT_AMBULATORY_CARE_PROVIDER_SITE_OTHER): Payer: 59 | Admitting: Family Medicine

## 2022-01-02 ENCOUNTER — Encounter: Payer: Self-pay | Admitting: Family Medicine

## 2022-01-02 VITALS — BP 130/71 | HR 82 | Temp 98.0°F | Ht 71.0 in | Wt 133.6 lb

## 2022-01-02 DIAGNOSIS — E782 Mixed hyperlipidemia: Secondary | ICD-10-CM | POA: Diagnosis not present

## 2022-01-02 DIAGNOSIS — E538 Deficiency of other specified B group vitamins: Secondary | ICD-10-CM

## 2022-01-02 DIAGNOSIS — E559 Vitamin D deficiency, unspecified: Secondary | ICD-10-CM | POA: Diagnosis not present

## 2022-01-02 MED ORDER — PRAVASTATIN SODIUM 40 MG PO TABS: 40.0000 mg | ORAL_TABLET | Freq: Every day | ORAL | 3 refills | Status: DC

## 2022-01-02 NOTE — Progress Notes (Signed)
Subjective:  Patient ID: Marvin Rose, male    DOB: 09-20-1935  Age: 86 y.o. MRN: [id number]  CC: Medical Management of Chronic Issues   HPI Marvin Rose presents for Pantoprazole caused him to have muscle aches, feet and ghand felt like they were asleep. Stopped pantoprazole and sx went away in 2-3 weeks.    in for follow-up of elevated cholesterol. Doing well without complaints on current medication. Denies side effects of statin including myalgia and arthralgia and nausea. Currently no chest pain, shortness of breath or other cardiovascular related symptoms noted.      01/02/2022    3:01 PM 11/22/2021   12:09 PM 07/11/2021    2:59 PM  Depression screen PHQ 2/9  Decreased Interest 0 0 0  Down, Depressed, Hopeless 0 0 0  PHQ - 2 Score 0 0 0  Altered sleeping   0  Tired, decreased energy   0  Change in appetite   0  Feeling bad or failure about yourself    0  Trouble concentrating   0  Moving slowly or fidgety/restless   0  Suicidal thoughts   0  PHQ-9 Score   0  Difficult doing work/chores   Not difficult at all    History Marvin Rose has a past medical history of Hyperlipidemia.   He has a past surgical history that includes Eye surgery and Kidney stone surgery.   His family history includes Cancer in his brother; Diabetes in his mother; Tuberculosis in his father.He reports that he has been smoking cigarettes. He has a 37.00 pack-year smoking history. He has never used smokeless tobacco. He reports that he does not currently use alcohol. He reports that he does not use drugs.    ROS Review of Systems  Constitutional:  Negative for fever.  Respiratory:  Negative for shortness of breath.   Cardiovascular:  Negative for chest pain.  Musculoskeletal:  Negative for arthralgias.  Skin:  Negative for rash.    Objective:  BP 130/71   Pulse 82   Temp 98 F (36.7 C)   Ht _0  (1.803 m)   Wt 133 lb 9.6 oz (60.6 kg)   SpO2 95%   BMI 18.63 kg/m   BP Readings  from Last 3 Encounters:  01/02/22 130/71  07/11/21 129/62  06/29/21 138/76    Wt Readings from Last 3 Encounters:  01/02/22 133 lb 9.6 oz (60.6 kg)  07/11/21 139 lb (63 kg)  06/29/21 138 lb 6.4 oz (62.8 kg)     Physical Exam Vitals reviewed.  Constitutional:      Appearance: He is well-developed.  HENT:     Head: Normocephalic and atraumatic.     Right Ear: External ear normal.     Left Ear: External ear normal.     Mouth/Throat:     Pharynx: No oropharyngeal exudate or posterior oropharyngeal erythema.  Eyes:     Pupils: Pupils are equal, round, and reactive to light.  Cardiovascular:     Rate and Rhythm: Normal rate and regular rhythm.     Heart sounds: No murmur heard. Pulmonary:     Effort: No respiratory distress.     Breath sounds: Normal breath sounds.  Musculoskeletal:     Cervical back: Normal range of motion and neck supple.  Neurological:     Mental Status: He is alert and oriented to person, place, and time.       Assessment & Plan:   Marvin Rose was seen today for medical  management of chronic issues.  Diagnoses and all orders for this visit:  Mixed hyperlipidemia -     CBC with Differential/Platelet -     CMP14+EGFR -     Lipid panel -     pravastatin (PRAVACHOL) 40 MG tablet; Take 1 tablet (40 mg total) by mouth daily.  Vitamin D deficiency -     VITAMIN D 25 Hydroxy (Vit-D Deficiency, Fractures)  Vitamin B12 deficiency -     Vitamin B12       I am having Marvin Rose maintain his Thera, Vitamin D3, Fish Oil, cyanocobalamin, pantoprazole, and pravastatin.  Allergies as of 01/02/2022       Reactions   Demerol  [meperidine Hcl] Rash   Meperidine And Related Rash   Proton Pump Inhibitors    Muscle aches, loss of Vit B12 & hands and feet numb        Medication List        Accurate as of January 02, 2022  3:24 PM. If you have any questions, ask your nurse or doctor.          cyanocobalamin 1000 MCG tablet Commonly known  as: VITAMIN B12 Take 1,000 mcg by mouth daily.   Fish Oil 1000 MG Caps Take 2,000 mg by mouth daily.   pantoprazole 40 MG tablet Commonly known as: PROTONIX Take 40 mg by mouth 2 (two) times daily.   pravastatin 40 MG tablet Commonly known as: PRAVACHOL Take 1 tablet (40 mg total) by mouth daily.   Thera Tabs Take by mouth.   Vitamin D3 10 MCG (400 UNIT) Caps Take by mouth.         Follow-up: Return in about 6 months (around 07/04/2022).  Claretta Fraise, M.D.

## 2022-01-03 LAB — CMP14+EGFR
ALT: 8 IU/L (ref 0–44)
AST: 16 IU/L (ref 0–40)
Albumin/Globulin Ratio: 2.1 (ref 1.2–2.2)
Albumin: 4.4 g/dL (ref 3.7–4.7)
Alkaline Phosphatase: 120 IU/L (ref 44–121)
BUN/Creatinine Ratio: 18 (ref 10–24)
BUN: 16 mg/dL (ref 8–27)
Bilirubin Total: 0.5 mg/dL (ref 0.0–1.2)
CO2: 26 mmol/L (ref 20–29)
Calcium: 9.3 mg/dL (ref 8.6–10.2)
Chloride: 102 mmol/L (ref 96–106)
Creatinine, Ser: 0.91 mg/dL (ref 0.76–1.27)
Globulin, Total: 2.1 g/dL (ref 1.5–4.5)
Glucose: 95 mg/dL (ref 70–99)
Potassium: 4.6 mmol/L (ref 3.5–5.2)
Sodium: 140 mmol/L (ref 134–144)
Total Protein: 6.5 g/dL (ref 6.0–8.5)
eGFR: 83 mL/min/{1.73_m2} (ref >59)

## 2022-01-03 LAB — CBC WITH DIFFERENTIAL/PLATELET
Basophils Absolute: 0 10*3/uL (ref 0.0–0.2)
Basos: 1 %
EOS (ABSOLUTE): 0.1 10*3/uL (ref 0.0–0.4)
Eos: 3 %
Hematocrit: 42.1 % (ref 37.5–51.0)
Hemoglobin: 13.8 g/dL (ref 13.0–17.7)
Immature Grans (Abs): 0 10*3/uL (ref 0.0–0.1)
Immature Granulocytes: 0 %
Lymphocytes Absolute: 1.4 10*3/uL (ref 0.7–3.1)
Lymphs: 32 %
MCH: 31.9 pg (ref 26.6–33.0)
MCHC: 32.8 g/dL (ref 31.5–35.7)
MCV: 97 fL (ref 79–97)
Monocytes Absolute: 0.5 10*3/uL (ref 0.1–0.9)
Monocytes: 12 %
Neutrophils Absolute: 2.2 10*3/uL (ref 1.4–7.0)
Neutrophils: 52 %
Platelets: 185 10*3/uL (ref 150–450)
RBC: 4.33 x10E6/uL (ref 4.14–5.80)
RDW: 12.3 % (ref 11.6–15.4)
WBC: 4.2 10*3/uL (ref 3.4–10.8)

## 2022-01-03 LAB — LIPID PANEL
Chol/HDL Ratio: 2.4 ratio (ref 0.0–5.0)
Cholesterol, Total: 148 mg/dL (ref 100–199)
HDL: 62 mg/dL (ref >39)
LDL Chol Calc (NIH): 75 mg/dL (ref 0–99)
Triglycerides: 51 mg/dL (ref 0–149)
VLDL Cholesterol Cal: 11 mg/dL (ref 5–40)

## 2022-01-03 LAB — VITAMIN D 25 HYDROXY (VIT D DEFICIENCY, FRACTURES): Vit D, 25-Hydroxy: 68.6 ng/mL (ref 30.0–100.0)

## 2022-01-03 LAB — VITAMIN B12: Vitamin B-12: >2000 pg/mL — ABNORMAL HIGH (ref 232–1245)

## 2022-04-04 ENCOUNTER — Encounter: Payer: Self-pay | Admitting: Family Medicine

## 2022-04-04 ENCOUNTER — Ambulatory Visit (INDEPENDENT_AMBULATORY_CARE_PROVIDER_SITE_OTHER): Payer: 59 | Admitting: Family Medicine

## 2022-04-04 VITALS — BP 126/73 | HR 76 | Temp 97.7°F | Ht 71.0 in | Wt 127.4 lb

## 2022-04-04 DIAGNOSIS — E538 Deficiency of other specified B group vitamins: Secondary | ICD-10-CM | POA: Diagnosis not present

## 2022-04-04 DIAGNOSIS — E782 Mixed hyperlipidemia: Secondary | ICD-10-CM

## 2022-04-04 NOTE — Progress Notes (Signed)
Subjective:  Patient ID: Marvin Rose, male    DOB: 09-20-1935  Age: 87 y.o. MRN: [id number]  CC: Medical Management of Chronic Issues   HPI Marvin Rose presents for  in for follow-up of elevated cholesterol. Doing well without complaints on current medication. Denies side effects of statin including myalgia and arthralgia and nausea. Currently no chest pain, shortness of breath or other cardiovascular related symptoms noted.  Also due for recheck of vitamin B-12.  He has a history of deficiency and is taking supplements orally.    04/04/2022    3:32 PM 01/02/2022    3:01 PM 11/22/2021   12:09 PM  Depression screen PHQ 2/9  Decreased Interest 0 0 0  Down, Depressed, Hopeless 0 0 0  PHQ - 2 Score 0 0 0    History Marvin Rose has a past medical history of Hyperlipidemia.   He has a past surgical history that includes Eye surgery and Kidney stone surgery.   His family history includes Cancer in his brother; Diabetes in his mother; Tuberculosis in his father.He reports that he has been smoking cigarettes. He has a 37.00 pack-year smoking history. He has never used smokeless tobacco. He reports that he does not currently use alcohol. He reports that he does not use drugs.    ROS Review of Systems  Constitutional:  Negative for fever.  Respiratory:  Negative for shortness of breath.   Cardiovascular:  Negative for chest pain.  Musculoskeletal:  Negative for arthralgias.  Skin:  Negative for rash.    Objective:  BP 126/73   Pulse 76   Temp 97.7 F (36.5 C)   Ht 5\' 11"  (1.803 m)   Wt 127 lb 6.4 oz (57.8 kg)   SpO2 96%   BMI 17.77 kg/m   BP Readings from Last 3 Encounters:  04/04/22 126/73  01/02/22 130/71  07/11/21 129/62    Wt Readings from Last 3 Encounters:  04/04/22 127 lb 6.4 oz (57.8 kg)  01/02/22 133 lb 9.6 oz (60.6 kg)  07/11/21 139 lb (63 kg)     Physical Exam Vitals reviewed.  Constitutional:      Appearance: He is well-developed.  HENT:      Head: Normocephalic and atraumatic.     Right Ear: External ear normal.     Left Ear: External ear normal.     Mouth/Throat:     Pharynx: No oropharyngeal exudate or posterior oropharyngeal erythema.  Eyes:     Pupils: Pupils are equal, round, and reactive to light.  Cardiovascular:     Rate and Rhythm: Normal rate and regular rhythm.     Heart sounds: No murmur heard. Pulmonary:     Effort: No respiratory distress.     Breath sounds: Normal breath sounds.  Musculoskeletal:     Cervical back: Normal range of motion and neck supple.  Neurological:     Mental Status: He is alert and oriented to person, place, and time.       Assessment & Plan:   Marvin Rose was seen today for medical management of chronic issues.  Diagnoses and all orders for this visit:  Vitamin B12 deficiency -     Vitamin B12 -     CBC with Differential/Platelet  Mixed hyperlipidemia -     CMP14+EGFR -     Lipid panel       I have discontinued Tana Conch. Marvin Rose's pantoprazole. I am also having him maintain his Thera, Vitamin D3, Fish Oil, cyanocobalamin, and pravastatin.  Allergies as of 04/04/2022       Reactions   Demerol  [meperidine Hcl] Rash   Meperidine And Related Rash   Proton Pump Inhibitors    Muscle aches, loss of Vit B12 & hands and feet numb        Medication List        Accurate as of April 04, 2022  9:43 PM. If you have any questions, ask your nurse or doctor.          STOP taking these medications    pantoprazole 40 MG tablet Commonly known as: PROTONIX Stopped by: Claretta Fraise, MD       TAKE these medications    cyanocobalamin 1000 MCG tablet Commonly known as: VITAMIN B12 Take 1,000 mcg by mouth daily.   Fish Oil 1000 MG Caps Take 2,000 mg by mouth daily.   pravastatin 40 MG tablet Commonly known as: PRAVACHOL Take 1 tablet (40 mg total) by mouth daily.   Thera Tabs Take by mouth.   Vitamin D3 10 MCG (400 UNIT) Caps Take by mouth.          Follow-up: Return in about 6 months (around 10/03/2022).  Claretta Fraise, M.D.

## 2022-04-05 LAB — CBC WITH DIFFERENTIAL/PLATELET
Basophils Absolute: 0 10*3/uL (ref 0.0–0.2)
Basos: 1 %
EOS (ABSOLUTE): 0 10*3/uL (ref 0.0–0.4)
Eos: 1 %
Hematocrit: 38.6 % (ref 37.5–51.0)
Hemoglobin: 12.6 g/dL — ABNORMAL LOW (ref 13.0–17.7)
Immature Grans (Abs): 0 10*3/uL (ref 0.0–0.1)
Immature Granulocytes: 0 %
Lymphocytes Absolute: 1.2 10*3/uL (ref 0.7–3.1)
Lymphs: 27 %
MCH: 31.1 pg (ref 26.6–33.0)
MCHC: 32.6 g/dL (ref 31.5–35.7)
MCV: 95 fL (ref 79–97)
Monocytes Absolute: 0.5 10*3/uL (ref 0.1–0.9)
Monocytes: 12 %
Neutrophils Absolute: 2.7 10*3/uL (ref 1.4–7.0)
Neutrophils: 59 %
Platelets: 296 10*3/uL (ref 150–450)
RBC: 4.05 x10E6/uL — ABNORMAL LOW (ref 4.14–5.80)
RDW: 12.7 % (ref 11.6–15.4)
WBC: 4.4 10*3/uL (ref 3.4–10.8)

## 2022-04-05 LAB — CMP14+EGFR
ALT: 9 IU/L (ref 0–44)
AST: 17 IU/L (ref 0–40)
Albumin/Globulin Ratio: 1.8 (ref 1.2–2.2)
Albumin: 4.2 g/dL (ref 3.7–4.7)
Alkaline Phosphatase: 98 IU/L (ref 44–121)
BUN/Creatinine Ratio: 14 (ref 10–24)
BUN: 13 mg/dL (ref 8–27)
Bilirubin Total: 0.3 mg/dL (ref 0.0–1.2)
CO2: 26 mmol/L (ref 20–29)
Calcium: 9.2 mg/dL (ref 8.6–10.2)
Chloride: 105 mmol/L (ref 96–106)
Creatinine, Ser: 0.92 mg/dL (ref 0.76–1.27)
Globulin, Total: 2.4 g/dL (ref 1.5–4.5)
Glucose: 86 mg/dL (ref 70–99)
Potassium: 5.1 mmol/L (ref 3.5–5.2)
Sodium: 143 mmol/L (ref 134–144)
Total Protein: 6.6 g/dL (ref 6.0–8.5)
eGFR: 82 mL/min/{1.73_m2} (ref >59)

## 2022-04-05 LAB — LIPID PANEL
Chol/HDL Ratio: 2.4 ratio (ref 0.0–5.0)
Cholesterol, Total: 149 mg/dL (ref 100–199)
HDL: 61 mg/dL (ref >39)
LDL Chol Calc (NIH): 79 mg/dL (ref 0–99)
Triglycerides: 41 mg/dL (ref 0–149)
VLDL Cholesterol Cal: 9 mg/dL (ref 5–40)

## 2022-04-05 LAB — VITAMIN B12: Vitamin B-12: 1019 pg/mL (ref 232–1245)

## 2022-04-09 NOTE — Progress Notes (Signed)
Hello Young,  Your lab result is normal and/or stable.Some minor variations that are not significant are commonly marked abnormal, but do not represent any medical problem for you.  Best regards, Claretta Fraise, M.D.

## 2022-07-17 ENCOUNTER — Telehealth: Payer: Self-pay | Admitting: Family Medicine

## 2022-07-17 NOTE — Telephone Encounter (Signed)
Contacted [name] to schedule their annual wellness visit. Appointment made for 11/27/2022.  Thank you,  Judeth Cornfield,  AMB Clinical Support [facility] AWV Program Direct Dial ??[phone]

## 2022-10-02 ENCOUNTER — Encounter: Payer: Self-pay | Admitting: Family Medicine

## 2022-10-02 ENCOUNTER — Ambulatory Visit (INDEPENDENT_AMBULATORY_CARE_PROVIDER_SITE_OTHER): Payer: 59 | Admitting: Family Medicine

## 2022-10-02 VITALS — BP 118/61 | HR 78 | Temp 97.8°F | Ht 71.0 in | Wt 125.4 lb

## 2022-10-02 DIAGNOSIS — E538 Deficiency of other specified B group vitamins: Secondary | ICD-10-CM | POA: Diagnosis not present

## 2022-10-02 DIAGNOSIS — K2100 Gastro-esophageal reflux disease with esophagitis, without bleeding: Secondary | ICD-10-CM | POA: Diagnosis not present

## 2022-10-02 DIAGNOSIS — E782 Mixed hyperlipidemia: Secondary | ICD-10-CM

## 2022-10-02 DIAGNOSIS — E559 Vitamin D deficiency, unspecified: Secondary | ICD-10-CM | POA: Diagnosis not present

## 2022-10-02 NOTE — Progress Notes (Signed)
Subjective:  Patient ID: [name], male    DOB: September 05, 1935  Age: 87 y.o. MRN: [id number]  CC: Medical Management of Chronic Issues   HPI QUINNTIN MIEDEMA presents for Patient in for follow-up of GERD. Currently  rarely symptomatic Using OTC alka seltzer chews.  There is no chest pain or heartburn. No hematemesis and no melena. No dysphagia or choking. Onset is remote. Progression is stable. Complicating factors, none.   in for follow-up of elevated cholesterol. Doing well without complaints on current medication. Denies side effects of statin including myalgia and arthralgia and nausea. Currently no chest pain, shortness of breath or other cardiovascular related symptoms noted.  Continues OTC Vitamin D & Vit B12.      10/02/2022    2:27 PM 04/04/2022    3:32 PM 01/02/2022    3:01 PM  Depression screen PHQ 2/9  Decreased Interest 0 0 0  Down, Depressed, Hopeless 0 0 0  PHQ - 2 Score 0 0 0    History Dontavian has a past medical history of Hyperlipidemia.   He has a past surgical history that includes Eye surgery and Kidney stone surgery.   His family history includes Cancer in his brother; Diabetes in his mother; Tuberculosis in his father.He reports that he has been smoking cigarettes. He has a 37 pack-year smoking history. He has never used smokeless tobacco. He reports that he does not currently use alcohol. He reports that he does not use drugs.    ROS Review of Systems  Constitutional:  Negative for fever.  Respiratory:  Negative for shortness of breath.   Cardiovascular:  Negative for chest pain.  Genitourinary:  Positive for difficulty urinating and frequency.  Musculoskeletal:  Negative for arthralgias.  Skin:  Negative for rash.    Objective:  BP 118/61   Pulse 78   Temp 97.8 F (36.6 C)   Ht 5\' 11"  (1.803 m)   Wt 125 lb 6.4 oz (56.9 kg)   SpO2 95%   BMI 17.49 kg/m   BP Readings from Last 3 Encounters:  10/02/22 118/61  04/04/22 126/73  01/02/22  130/71    Wt Readings from Last 3 Encounters:  10/02/22 125 lb 6.4 oz (56.9 kg)  04/04/22 127 lb 6.4 oz (57.8 kg)  01/02/22 133 lb 9.6 oz (60.6 kg)     Physical Exam Vitals reviewed.  Constitutional:      Appearance: He is well-developed.  HENT:     Head: Normocephalic and atraumatic.     Right Ear: External ear normal.     Left Ear: External ear normal.     Mouth/Throat:     Pharynx: No oropharyngeal exudate or posterior oropharyngeal erythema.  Eyes:     Pupils: Pupils are equal, round, and reactive to light.  Cardiovascular:     Rate and Rhythm: Normal rate and regular rhythm.     Heart sounds: No murmur heard. Pulmonary:     Effort: No respiratory distress.     Breath sounds: Normal breath sounds.  Musculoskeletal:     Cervical back: Normal range of motion and neck supple.  Neurological:     Mental Status: He is alert and oriented to person, place, and time.       Assessment & Plan:   [place] was seen today for medical management of chronic issues.  Diagnoses and all orders for this visit:  Gastroesophageal reflux disease with esophagitis without hemorrhage -     CBC with Differential/Platelet -  CMP14+EGFR  Mixed hyperlipidemia -     CMP14+EGFR -     Lipid panel  Vitamin B12 deficiency -     CBC with Differential/Platelet -     CMP14+EGFR -     Vitamin B12  Vitamin D deficiency -     CBC with Differential/Platelet -     CMP14+EGFR -     VITAMIN D 25 Hydroxy (Vit-D Deficiency, Fractures)       I am having Kahlil J. Riebel maintain his Thera, Vitamin D3, Fish Oil, cyanocobalamin, and pravastatin.  Allergies as of 10/02/2022       Reactions   Demerol  [meperidine Hcl] Rash   Meperidine And Related Rash   Proton Pump Inhibitors    Muscle aches, loss of Vit B12 & hands and feet numb        Medication List        Accurate as of October 02, 2022  3:05 PM. If you have any questions, ask your nurse or doctor.          cyanocobalamin  1000 MCG tablet Commonly known as: VITAMIN B12 Take 1,000 mcg by mouth daily.   Fish Oil 1000 MG Caps Take 2,000 mg by mouth daily.   pravastatin 40 MG tablet Commonly known as: PRAVACHOL Take 1 tablet (40 mg total) by mouth daily.   Thera Tabs Take by mouth.   Vitamin D3 10 MCG (400 UNIT) Caps Take by mouth.         Follow-up: No follow-ups on file.  Mechele Claude, M.D.

## 2022-11-27 ENCOUNTER — Ambulatory Visit: Payer: 59

## 2022-11-27 VITALS — Ht 71.0 in | Wt 125.0 lb

## 2022-11-27 DIAGNOSIS — Z122 Encounter for screening for malignant neoplasm of respiratory organs: Secondary | ICD-10-CM | POA: Diagnosis not present

## 2022-11-27 DIAGNOSIS — Z0000 Encounter for general adult medical examination without abnormal findings: Secondary | ICD-10-CM | POA: Diagnosis not present

## 2022-11-27 NOTE — Progress Notes (Signed)
Subjective:   Marvin Rose is a 87 y.o. male who presents for Medicare Annual/Subsequent preventive examination.  Visit Complete: Virtual  I connected with  Marvin Rose on 11/27/22 by a audio enabled telemedicine application and verified that I am speaking with the correct person using two identifiers.  Patient Location: Home  Provider Location: Home Office  I discussed the limitations of evaluation and management by telemedicine. The patient expressed understanding and agreed to proceed.  Patient Medicare AWV questionnaire was completed by the patient on 11/27/2022; I have confirmed that all information answered by patient is correct and no changes since this date. Cardiac Risk Factors include: advanced age (>35men, >46 women);male gender;dyslipidemia Vital Signs: Unable to obtain new vitals due to this being a telehealth visit.     Objective:    Today's Vitals   11/27/22 1134  Weight: 125 lb (56.7 kg)  Height: 5\' 11"  (1.803 m)   Body mass index is 17.43 kg/m.     11/27/2022   11:38 AM 11/22/2021   12:03 PM 09/09/2020    3:27 PM  Advanced Directives  Does Patient Have a Medical Advance Directive? No No No  Would patient like information on creating a medical advance directive? Yes (MAU/Ambulatory/Procedural Areas - Information given) No - Patient declined No - Patient declined    Current Medications (verified) Outpatient Encounter Medications as of 11/27/2022  Medication Sig   Cholecalciferol (VITAMIN D3) 10 MCG (400 UNIT) CAPS Take by mouth.   Multiple Vitamin (THERA) TABS Take by mouth.   Omega-3 Fatty Acids (FISH OIL) 1000 MG CAPS Take 2,000 mg by mouth daily.   pravastatin (PRAVACHOL) 40 MG tablet Take 1 tablet (40 mg total) by mouth daily.   vitamin B-12 (CYANOCOBALAMIN) 1000 MCG tablet Take 1,000 mcg by mouth daily.   No facility-administered encounter medications on file as of 11/27/2022.    Allergies (verified) Demerol  [meperidine hcl], Meperidine and  related, and Proton pump inhibitors   History: Past Medical History:  Diagnosis Date   Hyperlipidemia    Past Surgical History:  Procedure Laterality Date   EYE SURGERY     KIDNEY STONE SURGERY     Family History  Problem Relation Age of Onset   Diabetes Mother    Tuberculosis Father    Cancer Brother        brain   Social History   Socioeconomic History   Marital status: Married    Spouse name: Not on file   Number of children: Not on file   Years of education: Not on file   Highest education level: Not on file  Occupational History   Not on file  Tobacco Use   Smoking status: Every Day    Current packs/day: 0.50    Average packs/day: 0.5 packs/day for 74.0 years (37.0 ttl pk-yrs)    Types: Cigarettes   Smokeless tobacco: Never   Tobacco comments:    1/2 pack smoked daily  Substance and Sexual Activity   Alcohol use: Not Currently   Drug use: Never   Sexual activity: Not on file  Other Topics Concern   Not on file  Social History Narrative   Not on file   Social Determinants of Health   Financial Resource Strain: Low Risk  (11/27/2022)   Overall Financial Resource Strain (CARDIA)    Difficulty of Paying Living Expenses: Not hard at all  Food Insecurity: No Food Insecurity (11/27/2022)   Hunger Vital Sign    Worried About Running Out  of Food in the Last Year: Never true    Ran Out of Food in the Last Year: Never true  Transportation Needs: No Transportation Needs (11/27/2022)   PRAPARE - Administrator, Civil Service (Medical): No    Lack of Transportation (Non-Medical): No  Physical Activity: Insufficiently Active (11/27/2022)   Exercise Vital Sign    Days of Exercise per Week: 3 days    Minutes of Exercise per Session: 30 min  Stress: No Stress Concern Present (11/27/2022)   [employer] of Occupational Health - Occupational Stress Questionnaire    Feeling of Stress : Not at all  Social Connections: Socially Isolated (11/27/2022)   Social  Connection and Isolation Panel [NHANES]    Frequency of Communication with Friends and Family: More than three times a week    Frequency of Social Gatherings with Friends and Family: More than three times a week    Attends Religious Services: Never    Database administrator or Organizations: No    Attends Banker Meetings: Never    Marital Status: Widowed    Tobacco Counseling Ready to quit: Not Answered Counseling given: Not Answered Tobacco comments: 1/2 pack smoked daily   Clinical Intake:  Pre-visit preparation completed: Yes  Pain : No/denies pain     Nutritional Risks: None Diabetes: No  How often do you need to have someone help you when you read instructions, pamphlets, or other written materials from your doctor or pharmacy?: 1 - Never  Interpreter Needed?: No  Information entered by :: Renie Ora, LPN   Activities of Daily Living    11/27/2022   11:38 AM  In your present state of health, do you have any difficulty performing the following activities:  Hearing? 0  Vision? 0  Difficulty concentrating or making decisions? 0  Walking or climbing stairs? 0  Dressing or bathing? 0  Doing errands, shopping? 0  Preparing Food and eating ? N  Using the Toilet? N  In the past six months, have you accidently leaked urine? N  Do you have problems with loss of bowel control? N  Managing your Medications? N  Managing your Finances? N  Housekeeping or managing your Housekeeping? N    Patient Care Team: Mechele Claude, MD as PCP - General (Family Medicine)  Indicate any recent Medical Services you may have received from other than [facility] providers in the past year (date may be approximate).     Assessment:   This is a routine wellness examination for Marvin Rose.  Hearing/Vision screen Vision Screening - Comments:: Wears rx glasses - up to date with routine eye exams with  Dr.Johnson    Goals Addressed             This Visit's Progress     Patient Stated   On track    09/09/2020 AWV Goal: Fall Prevention  Over the next year, patient will decrease their risk for falls by: Using assistive devices, such as a cane or walker, as needed Identifying fall risks within their home and correcting them by: Removing throw rugs Adding handrails to stairs or ramps Removing clutter and keeping a clear pathway throughout the home Increasing light, especially at night Adding shower handles/bars Raising toilet seat Identifying potential personal risk factors for falls: Medication side effects Incontinence/urgency Vestibular dysfunction Hearing loss Musculoskeletal disorders Neurological disorders Orthostatic hypotension         Depression Screen    11/27/2022   11:37 AM 10/02/2022  2:27 PM 04/04/2022    3:32 PM 01/02/2022    3:01 PM 11/22/2021   12:09 PM 07/11/2021    2:59 PM 06/29/2021    3:02 PM  PHQ 2/9 Scores  PHQ - 2 Score 0 0 0 0 0 0 0  PHQ- 9 Score      0     Fall Risk    11/27/2022   11:35 AM 10/02/2022    2:27 PM 04/04/2022    3:32 PM 01/02/2022    3:01 PM 11/22/2021   12:09 PM  Fall Risk   Falls in the past year? 0 0 0 0 0  Number falls in past yr: 0      Injury with Fall? 0      Risk for fall due to : No Fall Risks      Follow up Falls prevention discussed    Falls evaluation completed    MEDICARE RISK AT HOME: Medicare Risk at Home Any stairs in or around the home?: Yes If so, are there any without handrails?: No Home free of loose throw rugs in walkways, pet beds, electrical cords, etc?: Yes Adequate lighting in your home to reduce risk of falls?: Yes Life alert?: No Use of a cane, walker or w/c?: No Grab bars in the bathroom?: Yes Shower chair or bench in shower?: Yes Elevated toilet seat or a handicapped toilet?: Yes  TIMED UP AND GO:  Was the test performed?  No    Cognitive Function:        11/27/2022   11:38 AM 11/22/2021   12:06 PM 09/09/2020    3:29 PM  6CIT Screen  What Year? 0 points  0 points 0 points  What month? 0 points 0 points 0 points  What time? 0 points 0 points 0 points  Count back from 20 0 points 0 points 0 points  Months in reverse 0 points 4 points 4 points  Repeat phrase 0 points 2 points 2 points  Total Score 0 points 6 points 6 points    Immunizations Immunization History  Administered Date(s) Administered   Moderna Sars-Covid-2 Vaccination 05/08/2019, 06/13/2019, 02/19/2020   Td 09/02/2014    TDAP status: Up to date  Flu Vaccine status: Declined, Education has been provided regarding the importance of this vaccine but patient still declined. Advised may receive this vaccine at local pharmacy or Health Dept. Aware to provide a copy of the vaccination record if obtained from local pharmacy or Health Dept. Verbalized acceptance and understanding.  Pneumococcal vaccine status: Declined,  Education has been provided regarding the importance of this vaccine but patient still declined. Advised may receive this vaccine at local pharmacy or Health Dept. Aware to provide a copy of the vaccination record if obtained from local pharmacy or Health Dept. Verbalized acceptance and understanding.   Covid-19 vaccine status: Completed vaccines  Qualifies for Shingles Vaccine? Yes   Zostavax completed No   Shingrix Completed?: No.    Education has been provided regarding the importance of this vaccine. Patient has been advised to call insurance company to determine out of pocket expense if they have not yet received this vaccine. Advised may also receive vaccine at local pharmacy or Health Dept. Verbalized acceptance and understanding.  Screening Tests Health Maintenance  Topic Date Due   INFLUENZA VACCINE  Never done   COVID-19 Vaccine (4 - 2023-24 season) 11/05/2022   Zoster Vaccines- Shingrix (1 of 2) 01/02/2023 (Originally 07/10/1985)   Pneumonia Vaccine 57+ Years old (1 of  2 - PCV) 01/03/2023 (Originally 07/10/1941)   Medicare Annual Wellness (AWV)  11/27/2023    DTaP/Tdap/Td (2 - Tdap) 09/01/2024   HPV VACCINES  Aged Out    Health Maintenance  Health Maintenance Due  Topic Date Due   INFLUENZA VACCINE  Never done   COVID-19 Vaccine (4 - 2023-24 season) 11/05/2022    Colorectal cancer screening: No longer required.   Lung Cancer Screening: (Low Dose CT Chest recommended if Age 12-80 years, 20 pack-year currently smoking OR have quit w/in 15years.) does qualify.   Lung Cancer Screening Referral: 11/27/2022  Additional Screening:  Hepatitis C Screening: does not qualify;   Vision Screening: Recommended annual ophthalmology exams for early detection of glaucoma and other disorders of the eye. Is the patient up to date with their annual eye exam?  Yes  Who is the provider or what is the name of the office in which the patient attends annual eye exams? Dr.Johnson   If pt is not established with a provider, would they like to be referred to a provider to establish care? No .   Dental Screening: Recommended annual dental exams for proper oral hygiene   Community Resource Referral / Chronic Care Management: CRR required this visit?  No   CCM required this visit?  No     Plan:     I have personally reviewed and noted the following in the patient's chart:   Medical and social history Use of alcohol, tobacco or illicit drugs  Current medications and supplements including opioid prescriptions. Patient is not currently taking opioid prescriptions. Functional ability and status Nutritional status Physical activity Advanced directives List of other physicians Hospitalizations, surgeries, and ER visits in previous 12 months Vitals Screenings to include cognitive, depression, and falls Referrals and appointments  In addition, I have reviewed and discussed with patient certain preventive protocols, quality metrics, and best practice recommendations. A written personalized care plan for preventive services as well as general preventive  health recommendations were provided to patient.     Lorrene Reid, LPN   1/61/0960   After Visit Summary: (MyChart) Due to this being a telephonic visit, the after visit summary with patients personalized plan was offered to patient via MyChart   Nurse Notes: none

## 2022-11-27 NOTE — Patient Instructions (Signed)
Marvin Rose , Thank you for taking time to come for your Medicare Wellness Visit. I appreciate your ongoing commitment to your health goals. Please review the following plan we discussed and let me know if I can assist you in the future.   Referrals/Orders/Follow-Ups/Clinician Recommendations: Aim for 30 minutes of exercise or brisk walking, 6-8 glasses of water, and 5 servings of fruits and vegetables each day.   This is a list of the screening recommended for you and due dates:  Health Maintenance  Topic Date Due   Flu Shot  Never done   COVID-19 Vaccine (4 - 2023-24 season) 11/05/2022   Zoster (Shingles) Vaccine (1 of 2) 01/02/2023*   Pneumonia Vaccine (1 of 2 - PCV) 01/03/2023*   Medicare Annual Wellness Visit  11/27/2023   DTaP/Tdap/Td vaccine (2 - Tdap) 09/01/2024   HPV Vaccine  Aged Out  *Topic was postponed. The date shown is not the original due date.    Advanced directives: (Provided) Advance directive discussed with you today. I have provided a copy for you to complete at home and have notarized. Once this is complete, please bring a copy in to our office so we can scan it into your chart. Information on Advanced Care Planning can be found at [facility] Advance Health Care Directives Advance Health Care Directives ([email or web address]    Next Medicare Annual Wellness Visit scheduled for next year: Yes  insert Preventive Care attachment Insert FALL PREVENTION attachment if needed

## 2023-01-18 ENCOUNTER — Other Ambulatory Visit: Payer: Self-pay | Admitting: Family Medicine

## 2023-01-18 DIAGNOSIS — E782 Mixed hyperlipidemia: Secondary | ICD-10-CM

## 2023-04-05 ENCOUNTER — Ambulatory Visit: Payer: 59 | Admitting: Family Medicine

## 2023-04-14 ENCOUNTER — Other Ambulatory Visit: Payer: Self-pay | Admitting: Family Medicine

## 2023-04-14 DIAGNOSIS — E782 Mixed hyperlipidemia: Secondary | ICD-10-CM

## 2023-04-18 ENCOUNTER — Ambulatory Visit: Payer: 59 | Admitting: Family Medicine

## 2023-05-28 ENCOUNTER — Ambulatory Visit: Payer: 59 | Admitting: Family Medicine

## 2023-06-07 ENCOUNTER — Encounter: Payer: Self-pay | Admitting: Family Medicine

## 2023-06-07 ENCOUNTER — Ambulatory Visit (INDEPENDENT_AMBULATORY_CARE_PROVIDER_SITE_OTHER): Admitting: Family Medicine

## 2023-06-07 VITALS — BP 123/73 | HR 78 | Temp 98.0°F | Ht 71.0 in | Wt 126.0 lb

## 2023-06-07 DIAGNOSIS — E782 Mixed hyperlipidemia: Secondary | ICD-10-CM

## 2023-06-07 DIAGNOSIS — K2100 Gastro-esophageal reflux disease with esophagitis, without bleeding: Secondary | ICD-10-CM

## 2023-06-07 MED ORDER — PRAVASTATIN SODIUM 40 MG PO TABS
40.0000 mg | ORAL_TABLET | Freq: Every day | ORAL | 1 refills | Status: DC
Start: 2023-06-07 — End: 2024-01-07

## 2023-06-07 NOTE — Progress Notes (Signed)
 Subjective:  Patient ID: Marvin Rose, male    DOB: [birth year]  Age: 88 y.o. MRN: [id number]  CC: Medical Management of Chronic Issues (No concerns at this time. )   HPI Marvin Rose presents for Patient in for follow-up of GERD. Currently asymptomatic taking  PPI daily. There is no chest pain or heartburn. No hematemesis and no melena. No dysphagia or choking. Onset is remote. Progression is stable. Complicating factors, none.   in for follow-up of elevated cholesterol. Doing well without complaints on current medication. Denies side effects of statin including myalgia and arthralgia and nausea. Currently no chest pain, shortness of breath or other cardiovascular related symptoms noted.  Has been noted to be deficient in Vitamins B12 & D. Due for recheck.        11/27/2022   11:37 AM 10/02/2022    2:27 PM 04/04/2022    3:32 PM  Depression screen PHQ 2/9  Decreased Interest 0 0 0  Down, Depressed, Hopeless 0 0 0  PHQ - 2 Score 0 0 0    History Briyan has a past medical history of Hyperlipidemia.   He has a past surgical history that includes Eye surgery and Kidney stone surgery.   His family history includes Cancer in his brother; Diabetes in his mother; Tuberculosis in his father.He reports that he has been smoking cigarettes. He has a 37 pack-year smoking history. He has never used smokeless tobacco. He reports that he does not currently use alcohol. He reports that he does not use drugs.    ROS Review of Systems  Constitutional: Negative.   HENT: Negative.    Eyes:  Negative for visual disturbance.  Respiratory:  Negative for cough and shortness of breath.   Cardiovascular:  Negative for chest pain and leg swelling.  Gastrointestinal:  Negative for abdominal pain, diarrhea, nausea and vomiting.  Genitourinary:  Negative for difficulty urinating.  Musculoskeletal:  Negative for arthralgias and myalgias.  Skin:  Negative for rash.  Neurological:  Negative for  headaches.  Psychiatric/Behavioral:  Negative for sleep disturbance.     Objective:  BP 123/73   Pulse 78   Temp 98 F (36.7 C)   Ht 5\' 11"  (1.803 m)   Wt 126 lb (57.2 kg)   SpO2 92%   BMI 17.57 kg/m   BP Readings from Last 3 Encounters:  06/07/23 123/73  10/02/22 118/61  04/04/22 126/73    Wt Readings from Last 3 Encounters:  06/07/23 126 lb (57.2 kg)  11/27/22 125 lb (56.7 kg)  10/02/22 125 lb 6.4 oz (56.9 kg)     Physical Exam Vitals reviewed.  Constitutional:      Appearance: He is well-developed.  HENT:     Head: Normocephalic and atraumatic.     Right Ear: External ear normal.     Left Ear: External ear normal.     Mouth/Throat:     Pharynx: No oropharyngeal exudate or posterior oropharyngeal erythema.  Eyes:     Pupils: Pupils are equal, round, and reactive to light.  Cardiovascular:     Rate and Rhythm: Normal rate and regular rhythm.     Heart sounds: No murmur heard. Pulmonary:     Effort: No respiratory distress.     Breath sounds: Normal breath sounds.  Musculoskeletal:     Cervical back: Normal range of motion and neck supple.  Neurological:     Mental Status: He is alert and oriented to person, place, and time.  Assessment & Plan:  Gastroesophageal reflux disease with esophagitis without hemorrhage  Mixed hyperlipidemia -     Pravastatin Sodium; Take 1 tablet (40 mg total) by mouth daily.  Dispense: 90 tablet; Refill: 1     Follow-up: Return in about 6 months (around 12/07/2023).  Mechele Claude, M.D.

## 2023-11-28 NOTE — Progress Notes (Unsigned)
 This encounter was created in error - please disregard.

## 2023-12-10 ENCOUNTER — Encounter: Payer: Self-pay | Admitting: Family Medicine

## 2023-12-10 ENCOUNTER — Ambulatory Visit (INDEPENDENT_AMBULATORY_CARE_PROVIDER_SITE_OTHER): Admitting: Family Medicine

## 2023-12-10 VITALS — BP 111/68 | HR 78 | Temp 98.2°F | Ht 71.0 in | Wt 122.4 lb

## 2023-12-10 DIAGNOSIS — E559 Vitamin D deficiency, unspecified: Secondary | ICD-10-CM

## 2023-12-10 DIAGNOSIS — K2100 Gastro-esophageal reflux disease with esophagitis, without bleeding: Secondary | ICD-10-CM | POA: Diagnosis not present

## 2023-12-10 DIAGNOSIS — E782 Mixed hyperlipidemia: Secondary | ICD-10-CM | POA: Diagnosis not present

## 2023-12-10 DIAGNOSIS — E538 Deficiency of other specified B group vitamins: Secondary | ICD-10-CM

## 2023-12-10 LAB — LIPID PANEL

## 2023-12-10 NOTE — Progress Notes (Signed)
 Subjective:  Patient ID: Marvin Rose, male    DOB: 1935-08-27  Age: 88 y.o. MRN: [id number]  CC: Medical Management of Chronic Issues   HPI  Discussed the use of AI scribe software for clinical note transcription with the patient, who gave verbal consent to proceed.  History of Present Illness Marvin Rose is an 88 year old male who presents with a dog bite on his finger.  He sustained a dog bite on his finger while putting trash in the trash can on Friday night. He received stitches the same night. He is currently taking amoxicillin following the dog bite. He also received a tetanus shot, as he had not had one in twelve years.  He is being followed for reflux and cholesterol issues. He has no current problems with reflux and uses an over-the-counter medication for occasional heartburn, which he finds effective. He is on pravastatin  for cholesterol management and noted a change in the pill's appearance due to a different supplier.  He has a history of vitamin B12 and vitamin D  deficiencies, which are being monitored. No recent breathing difficulties.          12/10/2023    2:19 PM 11/27/2022   11:37 AM 10/02/2022    2:27 PM  Depression screen PHQ 2/9  Decreased Interest 0 0 0  Down, Depressed, Hopeless 0 0 0  PHQ - 2 Score 0 0 0    History Marvin Rose has a past medical history of Hyperlipidemia.   He has a past surgical history that includes Eye surgery and Kidney stone surgery.   His family history includes Cancer in his brother; Diabetes in his mother; Tuberculosis in his father.He reports that he has been smoking cigarettes. He has a 37 pack-year smoking history. He has never used smokeless tobacco. He reports that he does not currently use alcohol. He reports that he does not use drugs.    ROS Review of Systems  Constitutional:  Negative for fever.  Respiratory:  Negative for shortness of breath.   Cardiovascular:  Negative for chest pain.  Musculoskeletal:   Negative for arthralgias.  Skin:  Negative for rash.    Objective:  BP 111/68   Pulse 78   Temp 98.2 F (36.8 C)   Ht 5' 11 (1.803 m)   Wt 122 lb 6.4 oz (55.5 kg)   SpO2 95%   BMI 17.07 kg/m   BP Readings from Last 3 Encounters:  12/10/23 111/68  06/07/23 123/73  10/02/22 118/61    Wt Readings from Last 3 Encounters:  12/10/23 122 lb 6.4 oz (55.5 kg)  06/07/23 126 lb (57.2 kg)  11/27/22 125 lb (56.7 kg)     Physical Exam Physical Exam GENERAL: Alert, cooperative, well developed, no acute distress. HEENT: Normocephalic, normal oropharynx, moist mucous membranes. CHEST: Clear to auscultation bilaterally, no wheezes, rhonchi, or crackles. CARDIOVASCULAR: Normal heart rate and rhythm, S1 and S2 normal without murmurs. ABDOMEN: Soft, non-tender, non-distended, without organomegaly, normal bowel sounds. EXTREMITIES: No cyanosis or edema. NEUROLOGICAL: Cranial nerves grossly intact, moves all extremities without gross motor or sensory deficit.   Assessment & Plan:  Mixed hyperlipidemia -     Vitamin B12 -     VITAMIN D  25 Hydroxy (Vit-D Deficiency, Fractures) -     Lipid panel -     CBC with Differential/Platelet -     CMP14+EGFR  Vitamin B12 deficiency -     Vitamin B12 -     VITAMIN D  25 Hydroxy (Vit-D  Deficiency, Fractures) -     Lipid panel -     CBC with Differential/Platelet -     CMP14+EGFR  Vitamin D  deficiency -     Vitamin B12 -     VITAMIN D  25 Hydroxy (Vit-D Deficiency, Fractures) -     Lipid panel -     CBC with Differential/Platelet -     CMP14+EGFR  Gastroesophageal reflux disease with esophagitis without hemorrhage -     Vitamin B12 -     VITAMIN D  25 Hydroxy (Vit-D Deficiency, Fractures) -     Lipid panel -     CBC with Differential/Platelet -     CMP14+EGFR    Assessment and Plan Assessment & Plan Dog bite wound of finger, healing, s/p stitches   The dog bite wound on his finger is healing well after stitches were placed on Friday  night. He received a tetanus shot as prophylaxis. Continue amoxicillin for infection prevention. No further tetanus booster is needed for 10 years.  Gastroesophageal reflux disease with esophagitis   He reports rare episodes of heartburn, which are effectively managed with over-the-counter medication. Continue current management with over-the-counter medication as needed for heartburn.  Mixed hyperlipidemia   Mixed hyperlipidemia is managed with pravastatin . He has no issues with the medication despite a change in manufacturer. Continue pravastatin  40 mg daily.  Vitamin B12 deficiency   Vitamin B12 deficiency was not specifically discussed in this encounter.  Vitamin D  deficiency   Vitamin D  deficiency was not specifically discussed in this encounter.       Follow-up: Return in about 6 months (around 06/09/2024).  Butler Der, M.D.

## 2023-12-11 ENCOUNTER — Ambulatory Visit: Payer: Self-pay | Admitting: Family Medicine

## 2023-12-11 LAB — CMP14+EGFR
ALT: 10 IU/L (ref 0–44)
AST: 20 IU/L (ref 0–40)
Albumin: 4 g/dL (ref 3.7–4.7)
Alkaline Phosphatase: 120 IU/L (ref 48–129)
BUN/Creatinine Ratio: 17 (ref 10–24)
BUN: 17 mg/dL (ref 8–27)
Bilirubin Total: 0.5 mg/dL (ref 0.0–1.2)
CO2: 28 mmol/L (ref 20–29)
Calcium: 9.3 mg/dL (ref 8.6–10.2)
Chloride: 101 mmol/L (ref 96–106)
Creatinine, Ser: 0.98 mg/dL (ref 0.76–1.27)
Globulin, Total: 2.7 g/dL (ref 1.5–4.5)
Glucose: 91 mg/dL (ref 70–99)
Potassium: 4.5 mmol/L (ref 3.5–5.2)
Sodium: 140 mmol/L (ref 134–144)
Total Protein: 6.7 g/dL (ref 6.0–8.5)
eGFR: 75 mL/min/1.73 (ref >59)

## 2023-12-11 LAB — CBC WITH DIFFERENTIAL/PLATELET
Basophils Absolute: 0 x10E3/uL (ref 0.0–0.2)
Basos: 0 %
EOS (ABSOLUTE): 0.2 x10E3/uL (ref 0.0–0.4)
Eos: 4 %
Hematocrit: 41.7 % (ref 37.5–51.0)
Hemoglobin: 13.3 g/dL (ref 13.0–17.7)
Immature Grans (Abs): 0 x10E3/uL (ref 0.0–0.1)
Immature Granulocytes: 0 %
Lymphocytes Absolute: 1.4 x10E3/uL (ref 0.7–3.1)
Lymphs: 27 %
MCH: 31.7 pg (ref 26.6–33.0)
MCHC: 31.9 g/dL (ref 31.5–35.7)
MCV: 100 fL — ABNORMAL HIGH (ref 79–97)
Monocytes Absolute: 0.6 x10E3/uL (ref 0.1–0.9)
Monocytes: 12 %
Neutrophils Absolute: 2.8 x10E3/uL (ref 1.4–7.0)
Neutrophils: 57 %
Platelets: 203 x10E3/uL (ref 150–450)
RBC: 4.19 x10E6/uL (ref 4.14–5.80)
RDW: 12.6 % (ref 11.6–15.4)
WBC: 5.1 x10E3/uL (ref 3.4–10.8)

## 2023-12-11 LAB — VITAMIN D 25 HYDROXY (VIT D DEFICIENCY, FRACTURES): Vit D, 25-Hydroxy: 51.1 ng/mL (ref 30.0–100.0)

## 2023-12-11 LAB — VITAMIN B12: Vitamin B-12: 472 pg/mL (ref 232–1245)

## 2023-12-11 LAB — LIPID PANEL
Cholesterol, Total: 163 mg/dL (ref 100–199)
HDL: 66 mg/dL (ref >39)
LDL CALC COMMENT:: 2.5 ratio (ref 0.0–5.0)
LDL Chol Calc (NIH): 84 mg/dL (ref 0–99)
Triglycerides: 66 mg/dL (ref 0–149)
VLDL Cholesterol Cal: 13 mg/dL (ref 5–40)

## 2023-12-11 NOTE — Progress Notes (Signed)
Hello Marvin Rose,  Your lab result is normal and/or stable.Some minor variations that are not significant are commonly marked abnormal, but do not represent any medical problem for you.  Best regards, Helon Wisinski, M.D.

## 2024-01-05 ENCOUNTER — Other Ambulatory Visit: Payer: Self-pay | Admitting: *Deleted

## 2024-01-05 DIAGNOSIS — E782 Mixed hyperlipidemia: Secondary | ICD-10-CM

## 2024-01-24 ENCOUNTER — Ambulatory Visit: Payer: Self-pay

## 2024-03-18 ENCOUNTER — Telehealth: Payer: Self-pay | Admitting: Family Medicine

## 2024-03-18 NOTE — Telephone Encounter (Signed)
 Verbal orders given and informed hospital follow up would be needed. LS

## 2024-03-18 NOTE — Telephone Encounter (Unsigned)
 Copied from CRM #[id number]. Topic: Clinical - Home Health Verbal Orders >> Mar 18, 2024  9:59 AM Graeme ORN wrote: Caller/Agency: Sigurd Nurse with [facility] Number: [phone] secure vm Service Requested: Skilled Nursing Frequency: 1 week 9 with 3 as needed Any new concerns about the patient? Yes - just got out of hospital Friday - has had follow ups with hospital Dr. But would like to know if he needs to see Dr Zollie before next appt.

## 2024-03-28 ENCOUNTER — Telehealth: Payer: Self-pay | Admitting: Family Medicine

## 2024-03-28 ENCOUNTER — Inpatient Hospital Stay: Admitting: Nurse Practitioner

## 2024-03-28 NOTE — Telephone Encounter (Signed)
 Verbal given.

## 2024-03-28 NOTE — Telephone Encounter (Signed)
 Copied from CRM #[id number]. Topic: Clinical - Home Health Verbal Orders >> Mar 28, 2024  9:29 AM Marda MATSU wrote: Caller/Agency: Chiquita DEL with [facility]  Callback Number: [phone] Service Requested: Physical Therapy Any new concerns about the patient? Yes Right jaw pain, has been taking tylenol. low bp 102/64 , no other cardiac symptoms   Declined speaking with medical but mentioned if provider need to speak with her please call.

## 2024-04-03 ENCOUNTER — Encounter: Payer: Self-pay | Admitting: Family Medicine

## 2024-04-03 ENCOUNTER — Ambulatory Visit: Admitting: Family Medicine

## 2024-04-03 ENCOUNTER — Telehealth: Payer: Self-pay

## 2024-04-03 ENCOUNTER — Ambulatory Visit (INDEPENDENT_AMBULATORY_CARE_PROVIDER_SITE_OTHER)

## 2024-04-03 VITALS — BP 125/68 | HR 95 | Temp 97.5°F | Ht 71.0 in | Wt 118.0 lb

## 2024-04-03 DIAGNOSIS — R197 Diarrhea, unspecified: Secondary | ICD-10-CM

## 2024-04-03 DIAGNOSIS — J189 Pneumonia, unspecified organism: Secondary | ICD-10-CM

## 2024-04-03 MED ORDER — DIPHENOXYLATE-ATROPINE 2.5-0.025 MG PO TABS: 1.0000 | ORAL_TABLET | Freq: Two times a day (BID) | ORAL | 0 refills | Status: AC | PRN

## 2024-04-03 NOTE — Telephone Encounter (Signed)
 Copied from CRM [id number]. Topic: Clinical - Prescription Issue >> Apr 03, 2024  3:29 PM Victoria B wrote: Reason for CRM: patient states, new med, diphenoxylate -atropine  (LOMOTIL ) 2.5-0.025 MG tablet isn't at [facility]. It shows I was received by pharmacy at 3:01 today.  [id number] - [place], [place] - [address]  Phone: [phone] Fax: [phone]

## 2024-04-03 NOTE — Telephone Encounter (Signed)
 Received no other action needed at this time. LS

## 2024-04-07 ENCOUNTER — Ambulatory Visit: Payer: Self-pay | Admitting: Family Medicine

## 2024-06-10 ENCOUNTER — Ambulatory Visit: Payer: Self-pay | Admitting: Family Medicine
